# Patient Record
Sex: Female | Born: 1945 | Race: Black or African American | Hispanic: No | Marital: Married | Smoking: Never smoker
Health system: Southern US, Community
[De-identification: ages and names within clinical notes are randomized; demographics above are authoritative.]

## PROBLEM LIST (undated history)

## (undated) DIAGNOSIS — E1169 Type 2 diabetes mellitus with other specified complication: Secondary | ICD-10-CM

## (undated) DIAGNOSIS — I10 Essential (primary) hypertension: Secondary | ICD-10-CM

## (undated) DIAGNOSIS — C50919 Malignant neoplasm of unspecified site of unspecified female breast: Secondary | ICD-10-CM

## (undated) DIAGNOSIS — E785 Hyperlipidemia, unspecified: Secondary | ICD-10-CM

## (undated) DIAGNOSIS — E119 Type 2 diabetes mellitus without complications: Secondary | ICD-10-CM

## (undated) DIAGNOSIS — I2510 Atherosclerotic heart disease of native coronary artery without angina pectoris: Secondary | ICD-10-CM

## (undated) DIAGNOSIS — I509 Heart failure, unspecified: Secondary | ICD-10-CM

## (undated) DIAGNOSIS — E669 Obesity, unspecified: Secondary | ICD-10-CM

---

## 2000-06-18 HISTORY — PX: BREAST LUMPECTOMY: SHX2

## 2017-08-18 ENCOUNTER — Encounter (HOSPITAL_COMMUNITY): Payer: Self-pay

## 2017-08-18 ENCOUNTER — Emergency Department (HOSPITAL_COMMUNITY): Payer: Medicare (Managed Care)

## 2017-08-18 ENCOUNTER — Other Ambulatory Visit: Payer: Self-pay

## 2017-08-18 ENCOUNTER — Inpatient Hospital Stay (HOSPITAL_COMMUNITY)
Admission: EM | Admit: 2017-08-18 | Discharge: 2017-08-22 | DRG: 291 | Disposition: A | Discharge status: Home / Self Care | Payer: Medicare (Managed Care) | Attending: Internal Medicine | Admitting: Internal Medicine

## 2017-08-18 DIAGNOSIS — E785 Hyperlipidemia, unspecified: Secondary | ICD-10-CM | POA: Diagnosis present

## 2017-08-18 DIAGNOSIS — E1169 Type 2 diabetes mellitus with other specified complication: Secondary | ICD-10-CM | POA: Diagnosis present

## 2017-08-18 DIAGNOSIS — E669 Obesity, unspecified: Secondary | ICD-10-CM | POA: Diagnosis present

## 2017-08-18 DIAGNOSIS — J810 Acute pulmonary edema: Secondary | ICD-10-CM

## 2017-08-18 DIAGNOSIS — E872 Acidosis: Secondary | ICD-10-CM | POA: Diagnosis present

## 2017-08-18 DIAGNOSIS — D649 Anemia, unspecified: Secondary | ICD-10-CM | POA: Diagnosis present

## 2017-08-18 DIAGNOSIS — N189 Chronic kidney disease, unspecified: Secondary | ICD-10-CM | POA: Diagnosis present

## 2017-08-18 DIAGNOSIS — Z794 Long term (current) use of insulin: Secondary | ICD-10-CM | POA: Diagnosis not present

## 2017-08-18 DIAGNOSIS — I2510 Atherosclerotic heart disease of native coronary artery without angina pectoris: Secondary | ICD-10-CM | POA: Diagnosis present

## 2017-08-18 DIAGNOSIS — I5031 Acute diastolic (congestive) heart failure: Secondary | ICD-10-CM | POA: Diagnosis not present

## 2017-08-18 DIAGNOSIS — N179 Acute kidney failure, unspecified: Secondary | ICD-10-CM | POA: Diagnosis present

## 2017-08-18 DIAGNOSIS — Z955 Presence of coronary angioplasty implant and graft: Secondary | ICD-10-CM

## 2017-08-18 DIAGNOSIS — I509 Heart failure, unspecified: Secondary | ICD-10-CM | POA: Diagnosis present

## 2017-08-18 DIAGNOSIS — I10 Essential (primary) hypertension: Secondary | ICD-10-CM | POA: Diagnosis not present

## 2017-08-18 DIAGNOSIS — I255 Ischemic cardiomyopathy: Secondary | ICD-10-CM | POA: Diagnosis present

## 2017-08-18 DIAGNOSIS — I130 Hypertensive heart and chronic kidney disease with heart failure and stage 1 through stage 4 chronic kidney disease, or unspecified chronic kidney disease: Principal | ICD-10-CM | POA: Diagnosis present

## 2017-08-18 DIAGNOSIS — C50919 Malignant neoplasm of unspecified site of unspecified female breast: Secondary | ICD-10-CM | POA: Diagnosis not present

## 2017-08-18 DIAGNOSIS — E1122 Type 2 diabetes mellitus with diabetic chronic kidney disease: Secondary | ICD-10-CM | POA: Diagnosis present

## 2017-08-18 DIAGNOSIS — J9602 Acute respiratory failure with hypercapnia: Secondary | ICD-10-CM

## 2017-08-18 DIAGNOSIS — Z853 Personal history of malignant neoplasm of breast: Secondary | ICD-10-CM | POA: Diagnosis not present

## 2017-08-18 DIAGNOSIS — R0902 Hypoxemia: Secondary | ICD-10-CM

## 2017-08-18 DIAGNOSIS — J9601 Acute respiratory failure with hypoxia: Secondary | ICD-10-CM | POA: Diagnosis present

## 2017-08-18 DIAGNOSIS — I5041 Acute combined systolic (congestive) and diastolic (congestive) heart failure: Secondary | ICD-10-CM | POA: Diagnosis not present

## 2017-08-18 HISTORY — DX: Malignant neoplasm of unspecified site of unspecified female breast: C50.919

## 2017-08-18 HISTORY — DX: Essential (primary) hypertension: I10

## 2017-08-18 HISTORY — DX: Atherosclerotic heart disease of native coronary artery without angina pectoris: I25.10

## 2017-08-18 HISTORY — DX: Type 2 diabetes mellitus with other specified complication: E66.9

## 2017-08-18 HISTORY — DX: Hyperlipidemia, unspecified: E78.5

## 2017-08-18 HISTORY — DX: Type 2 diabetes mellitus without complications: E11.9

## 2017-08-18 HISTORY — DX: Heart failure, unspecified: I50.9

## 2017-08-18 HISTORY — DX: Obesity, unspecified: E11.69

## 2017-08-18 LAB — I-STAT ARTERIAL BLOOD GAS, ED
Acid-base deficit: 3 mmol/L — ABNORMAL HIGH (ref 0.0–2.0)
Bicarbonate: 23.8 mmol/L (ref 20.0–28.0)
O2 Saturation: 100 %
PCO2 ART: 49.8 mmHg — AB (ref 32.0–48.0)
TCO2: 25 mmol/L (ref 22–32)
pH, Arterial: 7.287 — ABNORMAL LOW (ref 7.350–7.450)
pO2, Arterial: 191 mmHg — ABNORMAL HIGH (ref 83.0–108.0)

## 2017-08-18 LAB — RESPIRATORY PANEL BY PCR
Adenovirus: NOT DETECTED
BORDETELLA PERTUSSIS-RVPCR: NOT DETECTED
CORONAVIRUS OC43-RVPPCR: NOT DETECTED
Chlamydophila pneumoniae: NOT DETECTED
Coronavirus 229E: NOT DETECTED
Coronavirus HKU1: NOT DETECTED
Coronavirus NL63: NOT DETECTED
INFLUENZA A-RVPPCR: NOT DETECTED
Influenza B: NOT DETECTED
METAPNEUMOVIRUS-RVPPCR: NOT DETECTED
Mycoplasma pneumoniae: NOT DETECTED
PARAINFLUENZA VIRUS 1-RVPPCR: NOT DETECTED
PARAINFLUENZA VIRUS 2-RVPPCR: NOT DETECTED
PARAINFLUENZA VIRUS 4-RVPPCR: NOT DETECTED
Parainfluenza Virus 3: NOT DETECTED
RESPIRATORY SYNCYTIAL VIRUS-RVPPCR: NOT DETECTED
RHINOVIRUS / ENTEROVIRUS - RVPPCR: NOT DETECTED

## 2017-08-18 LAB — CBC WITH DIFFERENTIAL/PLATELET
Basophils Absolute: 0 10*3/uL (ref 0.0–0.1)
Basophils Relative: 0 %
Eosinophils Absolute: 0.3 10*3/uL (ref 0.0–0.7)
Eosinophils Relative: 4 %
HEMATOCRIT: 33.8 % — AB (ref 36.0–46.0)
Hemoglobin: 10.8 g/dL — ABNORMAL LOW (ref 12.0–15.0)
LYMPHS PCT: 29 %
Lymphs Abs: 2.8 10*3/uL (ref 0.7–4.0)
MCH: 28.3 pg (ref 26.0–34.0)
MCHC: 32 g/dL (ref 30.0–36.0)
MCV: 88.7 fL (ref 78.0–100.0)
MONO ABS: 0.9 10*3/uL (ref 0.1–1.0)
MONOS PCT: 9 %
NEUTROS ABS: 5.8 10*3/uL (ref 1.7–7.7)
Neutrophils Relative %: 58 %
Platelets: 392 10*3/uL (ref 150–400)
RBC: 3.81 MIL/uL — ABNORMAL LOW (ref 3.87–5.11)
RDW: 14.6 % (ref 11.5–15.5)
WBC: 9.7 10*3/uL (ref 4.0–10.5)

## 2017-08-18 LAB — COMPREHENSIVE METABOLIC PANEL
ALT: 19 U/L (ref 14–54)
AST: 21 U/L (ref 15–41)
Albumin: 3.5 g/dL (ref 3.5–5.0)
Alkaline Phosphatase: 102 U/L (ref 38–126)
Anion gap: 13 (ref 5–15)
BUN: 29 mg/dL — AB (ref 6–20)
CHLORIDE: 105 mmol/L (ref 101–111)
CO2: 20 mmol/L — AB (ref 22–32)
CREATININE: 1.72 mg/dL — AB (ref 0.44–1.00)
Calcium: 9.1 mg/dL (ref 8.9–10.3)
GFR calc Af Amer: 34 mL/min — ABNORMAL LOW (ref >60)
GFR, EST NON AFRICAN AMERICAN: 29 mL/min — AB (ref >60)
Glucose, Bld: 316 mg/dL — ABNORMAL HIGH (ref 65–99)
Potassium: 4.4 mmol/L (ref 3.5–5.1)
SODIUM: 138 mmol/L (ref 135–145)
Total Bilirubin: 0.7 mg/dL (ref 0.3–1.2)
Total Protein: 8.3 g/dL — ABNORMAL HIGH (ref 6.5–8.1)

## 2017-08-18 LAB — GLUCOSE, CAPILLARY
Glucose-Capillary: 180 mg/dL — ABNORMAL HIGH (ref 65–99)
Glucose-Capillary: 228 mg/dL — ABNORMAL HIGH (ref 65–99)

## 2017-08-18 LAB — HEMOGLOBIN A1C
Hgb A1c MFr Bld: 8.5 % — ABNORMAL HIGH (ref 4.8–5.6)
Mean Plasma Glucose: 197.25 mg/dL

## 2017-08-18 LAB — BRAIN NATRIURETIC PEPTIDE: B Natriuretic Peptide: 870.3 pg/mL — ABNORMAL HIGH (ref 0.0–100.0)

## 2017-08-18 LAB — TROPONIN I: Troponin I: <0.03 ng/mL (ref <0.03)

## 2017-08-18 MED ORDER — LABETALOL HCL 200 MG PO TABS
300.0000 mg | ORAL_TABLET | Freq: Two times a day (BID) | ORAL | Status: DC
  Administered 2017-08-18 – 2017-08-22 (×8): 300 mg via ORAL
  Filled 2017-08-18 (×8): qty 1

## 2017-08-18 MED ORDER — ASPIRIN 81 MG PO CHEW
81.0000 mg | CHEWABLE_TABLET | Freq: Every day | ORAL | Status: DC
  Administered 2017-08-19 – 2017-08-22 (×4): 81 mg via ORAL
  Filled 2017-08-18 (×4): qty 1

## 2017-08-18 MED ORDER — FUROSEMIDE 10 MG/ML IJ SOLN
80.0000 mg | Freq: Once | INTRAMUSCULAR | Status: AC
  Administered 2017-08-18: 80 mg via INTRAVENOUS
  Filled 2017-08-18: qty 8

## 2017-08-18 MED ORDER — INSULIN ASPART 100 UNIT/ML ~~LOC~~ SOLN
0.0000 [IU] | Freq: Every day | SUBCUTANEOUS | Status: DC
  Administered 2017-08-18: 2 [IU] via SUBCUTANEOUS
  Administered 2017-08-19: 4 [IU] via SUBCUTANEOUS
  Administered 2017-08-21: 3 [IU] via SUBCUTANEOUS

## 2017-08-18 MED ORDER — SODIUM CHLORIDE 0.9% FLUSH: 3.0000 mL | INTRAVENOUS | Status: DC | PRN

## 2017-08-18 MED ORDER — SODIUM CHLORIDE 0.9 % IV SOLN: 250.0000 mL | INTRAVENOUS | Status: DC | PRN

## 2017-08-18 MED ORDER — AMLODIPINE 1 MG/ML ORAL SUSPENSION: 10.0000 mg | Freq: Every day | ORAL | Status: DC

## 2017-08-18 MED ORDER — ENOXAPARIN SODIUM 40 MG/0.4ML ~~LOC~~ SOLN
40.0000 mg | SUBCUTANEOUS | Status: DC
  Administered 2017-08-20 – 2017-08-22 (×3): 40 mg via SUBCUTANEOUS
  Filled 2017-08-18 (×3): qty 0.4

## 2017-08-18 MED ORDER — AMLODIPINE BESYLATE 10 MG PO TABS
10.0000 mg | ORAL_TABLET | Freq: Every day | ORAL | Status: DC
  Administered 2017-08-18 – 2017-08-22 (×5): 10 mg via ORAL
  Filled 2017-08-18 (×2): qty 1
  Filled 2017-08-18: qty 2
  Filled 2017-08-18 (×2): qty 1

## 2017-08-18 MED ORDER — INSULIN ASPART 100 UNIT/ML ~~LOC~~ SOLN
0.0000 [IU] | Freq: Three times a day (TID) | SUBCUTANEOUS | Status: DC
  Administered 2017-08-18: 2 [IU] via SUBCUTANEOUS
  Administered 2017-08-19: 7 [IU] via SUBCUTANEOUS
  Administered 2017-08-19: 3 [IU] via SUBCUTANEOUS
  Administered 2017-08-19: 5 [IU] via SUBCUTANEOUS
  Administered 2017-08-20: 1 [IU] via SUBCUTANEOUS
  Administered 2017-08-20 – 2017-08-21 (×3): 3 [IU] via SUBCUTANEOUS
  Administered 2017-08-21: 1 [IU] via SUBCUTANEOUS
  Administered 2017-08-21: 3 [IU] via SUBCUTANEOUS
  Administered 2017-08-22: 2 [IU] via SUBCUTANEOUS
  Administered 2017-08-22: 5 [IU] via SUBCUTANEOUS
  Administered 2017-08-22: 3 [IU] via SUBCUTANEOUS

## 2017-08-18 MED ORDER — NITROGLYCERIN IN D5W 200-5 MCG/ML-% IV SOLN
INTRAVENOUS | Status: AC
  Filled 2017-08-18: qty 250

## 2017-08-18 MED ORDER — ASPIRIN 81 MG PO CHEW
324.0000 mg | CHEWABLE_TABLET | Freq: Once | ORAL | Status: AC
  Administered 2017-08-18: 324 mg via ORAL
  Filled 2017-08-18: qty 4

## 2017-08-18 MED ORDER — SODIUM CHLORIDE 0.9% FLUSH
3.0000 mL | Freq: Two times a day (BID) | INTRAVENOUS | Status: DC
  Administered 2017-08-18 – 2017-08-22 (×8): 3 mL via INTRAVENOUS

## 2017-08-18 MED ORDER — ACETAMINOPHEN 325 MG PO TABS
650.0000 mg | ORAL_TABLET | ORAL | Status: DC | PRN
  Administered 2017-08-20: 650 mg via ORAL
  Filled 2017-08-18: qty 2

## 2017-08-18 MED ORDER — NITROGLYCERIN IN D5W 200-5 MCG/ML-% IV SOLN
0.0000 ug/min | Freq: Once | INTRAVENOUS | Status: AC
  Administered 2017-08-18: 100 ug/min via INTRAVENOUS

## 2017-08-18 MED ORDER — ONDANSETRON HCL 4 MG/2ML IJ SOLN: 4.0000 mg | Freq: Four times a day (QID) | INTRAMUSCULAR | Status: DC | PRN

## 2017-08-18 NOTE — H&P (Addendum)
History and Physical    Charlotte Phelps ZOX:[id number] DOB: 1946-04-02 DOA: 08/18/2017  **Will admit patient based on the expectation that the patient will need hospitalization/ hospital care that crosses at least 2 midnights  PCP: Patient, No Pcp Per Marlis Edelson, MD  Attending physician: Evangeline Gula  Patient coming from/Resides with: Private residence/husband  Chief Complaint: Acute shortness of breath  HPI: Charlotte Phelps is a 72 y.o. female with medical history significant for CAD with reported prior stent, CHF (?  Ischemic cardiomyopathy), hypertension, diabetes, obesity, dyslipidemia, history of breast cancer in 2002 is post lumpectomy with x-ray therapy and Adriamycin and Cytoxan in 2002 followed by tamoxifen x 2.5 years and Aromasin x 5 years.  Patient currently resides in [place] with her husband and has traveled to this area to visit their daughter.  Patient was last hospitalized for heart failure symptomatology on February 7 and discharged on February 10.  During that hospitalization she underwent echocardiogram.  Patient denies recent changes in medications.  She reports her dry weight is 230 pounds.  She typically takes an extra 40 mg of Lasix twice weekly for weight gain to prevent exacerbation of heart failure.  Patient arrived to this area about 1 week ago and this past Friday developed nonspecific, nonproductive cough without fever although in the past several days her husband reported she was having chills.  She had taken over-the-counter cold preparation appropriate for a person with hypertension.  She has not had any dietary indiscretion and did not miss any medications or indulged in dietary indiscretion since arriving to [place].  She awakened this morning at her usual time and after she began moving around noticed significant worsening shortness of breath.  She was brought to the ER by family; on upon arrival to triage patient was in extremis with labored respirations  at 40 breaths/min with bilateral crackles and she was unable to talk in complete sentences.  She was immediately taken to a treatment room.  Due to severity of symptoms room air O2 saturations were not obtained and patient was immediately placed on BiPAP with 100% FiO2 and after 10 minutes was noted to be breathing more with O2 sats in the upper 90s.  ABG was obtained that revealed respiratory acidosis.  X-ray revealed findings consistent with heart failure.  Since blood pressure was significantly elevated at presentation at 189/102.  She was given Lasix 80 mg IV x1 and was briefly initiated on a nitroglycerin infusion..  Subsequent BNP was elevated at 870.  Her PO2 on 100% FiO2 was 191.  After about 45 minutes her symptoms had improved remarkably and she had voided a large amount of urine.  She was able to be transitioned to nasal cannula oxygen and the nitroglycerin infusion was discontinued.  She denied any chest pain or dyspnea on exertion and reports that her blood pressure has been well controlled on her current at-home regimen.  ED Course:  Vital Signs: BP (!) 165/70   Pulse 81   Resp 17   SpO2 98%  CXR: Congestive heart failure with interstitial and airspace pulmonary edema Lab data: Sodium 138, potassium 4.4, chloride 105, CO2 20, glucose 316, BUN 29, creatinine 1.72, LFTs not elevated, BNP 870, troponin <0.03, count 9700 normal differential, hemoglobin 10.8, platelets 392,000 Medications and treatmetns:Nitroglycerin infusion subsequently discontinued, Lasix 80 mg IV x1, aspirin 324 mg x1  Review of Systems:  In addition to the HPI above,  No Fever, lmyalgias or other constitutional symptoms No Headache, changes with Vision or hearing,  new weakness, tingling, numbness in any extremity, dizziness, dysarthria or word finding difficulty, gait disturbance or imbalance, tremors or seizure activity No problems swallowing food or Liquids, indigestion/reflux, choking or coughing while eating,  abdominal pain with or after eating No Chest pain, palpitations, orthopnea No Abdominal pain, N/V, melena,hematochezia, dark tarry stools, constipation No dysuria, malodorous urine, hematuria or flank pain No new skin rashes, lesions, masses or bruises, No new joint pains, aches, swelling or redness No recent unintentional weight gain or loss No polyuria, polydypsia or polyphagia   Past Medical History:  Diagnosis Date  . Breast cancer ([place])    Breast Cancer - T1cN1 ER pos right lumpectomy/XRT, Adria/Cytoxan 2002, tamoxifen x 2.5 years, Aromasin x years  . CAD (coronary artery disease)    History of prior stent  . CHF (congestive heart failure) ([place])   . Diabetes mellitus type 2 in obese ([place])   . HLD (hyperlipidemia)   . HTN (hypertension)     History reviewed. No pertinent surgical history.  Social History   Socioeconomic History  . Marital status: Married    Spouse name: Not on file  . Number of children: Not on file  . Years of education: Not on file  . Highest education level: Not on file  Social Needs  . Financial resource strain: Not on file  . Food insecurity - worry: Not on file  . Food insecurity - inability: Not on file  . Transportation needs - medical: Not on file  . Transportation needs - non-medical: Not on file  Occupational History  . Not on file  Tobacco Use  . Smoking status: Never Smoker  . Smokeless tobacco: Never Used  Substance and Sexual Activity  . Alcohol use: No    Frequency: Never  . Drug use: No  . Sexual activity: Not on file  Other Topics Concern  . Not on file  Social History Narrative  . Not on file    Mobility: Independent Work history: Not Obtained   Allergies  Allergen Reactions  . Lisinopril Other (See Comments)    Watery Eyes  . Sulfa Antibiotics      Family history reviewed and not pertinent to current admission findings and diagnosis  Prior to Admission medications   **Based on patient recollection Aspirin 81  mg daily Amlodipine 10 mg daily Labetalol 300 mg twice daily Lasix 40 mg daily     Physical Exam: Vitals:   08/18/17 1130 08/18/17 1135 08/18/17 1145 08/18/17 1155  BP: (!) 164/65 (!) 157/70 (!) 162/79 (!) 165/70  Pulse: 78 72 84 81  Resp: 17 17 14 17   SpO2: 97% 97% 97% 98%      Constitutional: NAD, calm, comfortable Eyes: PERRL, lids and conjunctivae normal ENMT: Mucous membranes are moist. Posterior pharynx clear of any exudate or lesions.Normal dentition.  Neck: normal, supple, no masses, no thyromegaly Respiratory: Bilateral crackles on posterior exam.  Normal respiratory effort without accessory muscle use at rest. 2L Cardiovascular: Regular rate and rhythm, no murmurs / rubs / gallops.  Trace to 1+ bilateral lower extremity edema. 2+ pedal pulses. No carotid bruits.  Abdomen: no tenderness, no masses palpated. No hepatosplenomegaly. Bowel sounds positive.  Musculoskeletal: no clubbing / cyanosis. No joint deformity upper and lower extremities. Good ROM, no contractures. Normal muscle tone.  Skin: no rashes, lesions, ulcers. No induration Neurologic: CN 2-12 grossly intact. Sensation intact, DTR normal. Strength 5/5 x all 4 extremities.  Psychiatric: Normal judgment and insight. Alert and oriented x 3. Normal mood.  Labs on Admission: I have personally reviewed following labs and imaging studies  CBC: Recent Labs  Lab 08/18/17 0905  WBC 9.7  NEUTROABS 5.8  HGB 10.8*  HCT 33.8*  MCV 88.7  PLT 532   Basic Metabolic Panel: Recent Labs  Lab 08/18/17 0905  NA 138  K 4.4  CL 105  CO2 20*  GLUCOSE 316*  BUN 29*  CREATININE 1.72*  CALCIUM 9.1   GFR: CrCl cannot be calculated (Unknown ideal weight.). Liver Function Tests: Recent Labs  Lab 08/18/17 0905  AST 21  ALT 19  ALKPHOS 102  BILITOT 0.7  PROT 8.3*  ALBUMIN 3.5   No results for input(s): LIPASE, AMYLASE in the last 168 hours. No results for input(s): AMMONIA in the last 168  hours. Coagulation Profile: No results for input(s): INR, PROTIME in the last 168 hours. Cardiac Enzymes: Recent Labs  Lab 08/18/17 0905  TROPONINI <0.03   BNP (last 3 results) No results for input(s): PROBNP in the last 8760 hours. HbA1C: No results for input(s): HGBA1C in the last 72 hours. CBG: No results for input(s): GLUCAP in the last 168 hours. Lipid Profile: No results for input(s): CHOL, HDL, LDLCALC, TRIG, CHOLHDL, LDLDIRECT in the last 72 hours. Thyroid Function Tests: No results for input(s): TSH, T4TOTAL, FREET4, T3FREE, THYROIDAB in the last 72 hours. Anemia Panel: No results for input(s): VITAMINB12, FOLATE, FERRITIN, TIBC, IRON, RETICCTPCT in the last 72 hours. Urine analysis: No results found for: COLORURINE, APPEARANCEUR, LABSPEC, PHURINE, GLUCOSEU, HGBUR, BILIRUBINUR, KETONESUR, PROTEINUR, UROBILINOGEN, NITRITE, LEUKOCYTESUR Sepsis Labs: @LABRCNTIP (procalcitonin:4,lacticidven:4) )No results found for this or any previous visit (from the past 240 hour(s)).   Radiological Exams on Admission: Dg Chest Portable 1 View  Result Date: 08/18/2017 CLINICAL DATA:  Pt to ER accompanied by son, reports acute onset shortness of breath this morning, at this time patient tripoding in triage, labored respirations 40+, EXAM: PORTABLE CHEST 1 VIEW COMPARISON:  None. FINDINGS: Cardiac silhouette is borderline enlarged. No mediastinal or hilar masses. Lungs show prominent bronchovascular markings, mild interstitial thickening and hazy airspace opacity, the latter in the lower lungs. Probable pleural effusions. No pneumothorax. Skeletal structures are demineralized but grossly intact. IMPRESSION: 1. Findings consistent with congestive heart failure with interstitial and airspace pulmonary edema. Electronically Signed   By: Lajean Manes M.D.   On: 08/18/2017 09:34    EKG: (Independently reviewed) sinus rhythm with ventricular rate 90 bpm, QTC 459 ms, slightly delayed R wave rotation  with possible right axis deviation, no acute ischemic changes  Assessment/Plan Principal Problem:   Acute respiratory failure with hypoxia and hypercarbia  -Patient presents with several days of productive cough and chills followed by acute respiratory failure requiring transient BiPAP support-now stable on 2 L oxygen -Suspect patient has primary CHF exacerbation potentially exacerbated by viral syndrome -Check respiratory viral panel -Treat underlying heart failure and hypertension -Continue supportive care with oxygen-wean as tolerated -Follow pulse oximetry  Active Problems:   Acute congestive heart failure  -Presents with acute respiratory symptoms as above, elevated BNP and chest x-ray consistent with heart failure exacerbation -Subtype unknown with husband reporting that patient's heart failure was "treated" after stent was placed leading one to surmise this is ischemic cardiomyopathy -Patient hospitalized early February for heart failure exacerbation and underwent echo in [place]-we will attempt to obtain records from [facility] facility is not on epic -Symptoms improved with administration of Lasix 80 mg IV in ER-repeat x1 dose and will allow for rounding physician in a.m. to  determine if appropriate to transition to home dose Lasix 40 mg daily -Does not appear to have been on ARB prior to admission-patient reportedly has watery eyes with ACE I -Continue labetalol -Daily weights and strict I's/O -Patient reports dry weight at 230 pounds -According to patient and family typically has to take 2 extra 40 mg doses of Lasix for weight gain weekly    ? Acute kidney injury  -Baseline renal function unknown -Current renal function BUN 29 and creatinine 1.72 -Follow labs with diuresis-if improved would confirm diagnosis of AKI    HTN (hypertension) -Uncontrolled presentation in context of volume overload and respiratory extremis -Continue preadmission Norvasc and  labetalol    Diabetes mellitus type 2 in obese  -Patient reports no longer on metformin and instead utilizes regular insulin meal coverage -SSI -HgbA1c    CAD -Reportedly has prior PCI/stent -Denies issues with chest pain or dyspnea on exertion -Continue beta-blocker and aspirin -Does not appear to be on statin prior to admission    HLD (hyperlipidemia) -Based on patient recollection does not take statin drug    Anemia -Not listed as chronic medical problem -Hemoglobin 10.8 with unknown baseline    History of Breast cancer (2003)    **Additional lab, imaging and/or diagnostic evaluation at discretion of supervising physician  DVT prophylaxis: Lovenox Code Status: Full Family Communication: Husband and daughter Disposition Plan: Home Consults called: None    Charlotte Phelps L. ANP-BC [facility] Pager [phone]   If 7PM-7AM, please contact night-coverage [email or web address] Password [facility]  08/18/2017, 12:59 PM

## 2017-08-18 NOTE — ED Provider Notes (Signed)
[facility] DEPARTMENT Provider Note   CSN: [id number] Arrival date & time: 08/18/17  0845     History   Chief Complaint Chief Complaint  Patient presents with  . Shortness of Breath    HPI Charlotte Phelps is a 72 y.o. female.  HPI  72 year old female with a history of CHF presents with acute dyspnea.  History is taken from patient although this is limited due to her extreme dyspnea.  Husband is also at the bedside to provide history.  Has had a nonproductive cough for couple days and thinks she caught a cold.  However this morning after getting up to go the bathroom she has developed severe dyspnea.  She denies any chest pain.  Family feels like her feet and ankles are swollen.  This feels like prior CHF to her. No fevers. She does not wear oxygen at home. She is traveling from [place] visiting daughter. Denies any change or missed meds, such as BP meds or lasix.  When I am initially evaluating her her O2 sats are in the low 70s on nasal cannula.  Past Medical History:  Diagnosis Date  . Breast cancer ([place])    Breast Cancer - T1cN1 ER pos right lumpectomy/XRT, Adria/Cytoxan 2002, tamoxifen x 2.5 years, Aromasin x years  . CAD (coronary artery disease)    History of prior stent  . CHF (congestive heart failure) ([place])   . Diabetes mellitus type 2 in obese ([place])   . HLD (hyperlipidemia)   . HTN (hypertension)     Patient Active Problem List   Diagnosis Date Noted  . Acute respiratory failure with hypoxia and hypercarbia ([place]) 08/18/2017  . Acute congestive heart failure ([place]) 08/18/2017  . HTN (hypertension) 08/18/2017  . HLD (hyperlipidemia) 08/18/2017  . Diabetes mellitus type 2 in obese ([place]) 08/18/2017  . History of Breast cancer (2003) 08/18/2017  . Acute kidney injury ([place]) 08/18/2017  . Anemia 08/18/2017    Past Surgical History:  Procedure Laterality Date  . BREAST LUMPECTOMY Right 2002    OB History    No data available        Home Medications    Prior to Admission medications   Medication Sig Start Date End Date Taking? Authorizing Provider  acetaminophen (TYLENOL) 500 MG tablet Take 1,000 mg by mouth every 6 (six) hours as needed for mild pain.   Yes [provider]  amLODipine (NORVASC) 10 MG tablet Take 10 mg by mouth daily. 07/24/17  Yes [provider]  aspirin EC 81 MG tablet Take 81 mg by mouth daily. 01/15/12  Yes [provider]  atorvastatin (LIPITOR) 80 MG tablet Take 80 mg by mouth daily.   Yes [provider]  Cholecalciferol (VITAMIN D3) 5000 units CAPS Take 10,000 Units by mouth daily.   Yes [provider]  Dextromethorphan-guaiFENesin (CORICIDIN HBP CONGESTION/COUGH) 10-200 MG CAPS Take 2 tablets by mouth as needed (Congestion and Cough).   Yes [provider]  furosemide (LASIX) 20 MG tablet Take 40 mg by mouth daily. 07/15/17  Yes [provider]  GuaiFENesin (MUCUS RELIEF ADULT PO) Take 1 tablet by mouth 2 (two) times daily.   Yes [provider]  losartan (COZAAR) 100 MG tablet Take 100 mg by mouth daily. 08/15/17  Yes [provider]  Multiple Vitamins-Minerals (WOMENS 50+ ADVANCED PO) Take 1 tablet by mouth daily.   Yes [provider]  NOVOLOG FLEXPEN 100 UNIT/ML FlexPen Inject 35 Units into the skin 3 (three)  times daily with meals. 08/07/17  Yes [provider]  TRESIBA FLEXTOUCH 100 UNIT/ML SOPN FlexTouch Pen Inject 35-60 Units into the skin daily. Sliding Scale 08/08/17  Yes [provider]  VENTOLIN HFA 108 (90 Base) MCG/ACT inhaler Inhale 2 puffs into the lungs every 6 (six) hours as needed for shortness of breath or wheezing. 08/15/17  Yes [provider]    Family History History reviewed. No pertinent family history.  Social History Social History   Tobacco Use  . Smoking status: Never Smoker  . Smokeless tobacco: Never Used  Substance Use Topics  . Alcohol use:  No    Frequency: Never  . Drug use: No     Allergies   Lisinopril and Sulfa antibiotics   Review of Systems Review of Systems  Unable to perform ROS: Severe respiratory distress     Physical Exam Updated Vital Signs BP (!) 173/74   Pulse 87   Resp (!) 28   SpO2 100%   Physical Exam  Constitutional: She is oriented to person, place, and time. She appears well-developed and well-nourished. She appears distressed.  obese  HENT:  Head: Normocephalic and atraumatic.  Right Ear: External ear normal.  Left Ear: External ear normal.  Nose: Nose normal.  Eyes: Right eye exhibits no discharge. Left eye exhibits no discharge.  Cardiovascular: Normal rate, regular rhythm and normal heart sounds.  Pulmonary/Chest: Accessory muscle usage present. Tachypnea noted. She is in respiratory distress. She has rales in the right lower field and the left lower field.  Abdominal: Soft. There is no tenderness.  Musculoskeletal: She exhibits edema (trace nonpitting edema to bilateral feet).  Neurological: She is alert and oriented to person, place, and time.  Skin: Skin is warm and dry. She is not diaphoretic.  Nursing note and vitals reviewed.    ED Treatments / Results  Labs (all labs ordered are listed, but only abnormal results are displayed) Labs Reviewed  COMPREHENSIVE METABOLIC PANEL - Abnormal; Notable for the following components:      Result Value   CO2 20 (*)    Glucose, Bld 316 (*)    BUN 29 (*)    Creatinine, Ser 1.72 (*)    Total Protein 8.3 (*)    GFR calc non Af Amer 29 (*)    GFR calc Af Amer 34 (*)    All other components within normal limits  BRAIN NATRIURETIC PEPTIDE - Abnormal; Notable for the following components:   B Natriuretic Peptide 870.3 (*)    All other components within normal limits  CBC WITH DIFFERENTIAL/PLATELET - Abnormal; Notable for the following components:   RBC 3.81 (*)    Hemoglobin 10.8 (*)    HCT 33.8 (*)    All other components within  normal limits  I-STAT ARTERIAL BLOOD GAS, ED - Abnormal; Notable for the following components:   pH, Arterial 7.287 (*)    pCO2 arterial 49.8 (*)    pO2, Arterial 191.0 (*)    Acid-base deficit 3.0 (*)    All other components within normal limits  RESPIRATORY PANEL BY PCR  TROPONIN I  HEMOGLOBIN A1C    EKG  EKG Interpretation  Date/Time:  Sunday August 18 2017 08:50:08 EST Ventricular Rate:  95 PR Interval:  160 QRS Duration: 90 QT Interval:  366 QTC Calculation: 459 R Axis:   85 Text Interpretation:  Normal sinus rhythm Nonspecific ST abnormality Abnormal ECG poor data quality limb leads No old tracing to compare Confirmed by Sherwood Gambler (  [id number]) on 08/18/2017 8:54:45 AM       EKG Interpretation  Date/Time:  Sunday August 18 2017 09:13:03 EST Ventricular Rate:  90 PR Interval:  160 QRS Duration: 96 QT Interval:  375 QTC Calculation: 459 R Axis:   84 Text Interpretation:  Sinus rhythm Borderline right axis deviation mild ST depressions better tracing, otherwise similar to earlier in the day Confirmed by Sherwood Gambler [phone]) on 08/18/2017 9:16:27 AM       Radiology Dg Chest Portable 1 View  Result Date: 08/18/2017 CLINICAL DATA:  Pt to ER accompanied by son, reports acute onset shortness of breath this morning, at this time patient tripoding in triage, labored respirations 40+, EXAM: PORTABLE CHEST 1 VIEW COMPARISON:  None. FINDINGS: Cardiac silhouette is borderline enlarged. No mediastinal or hilar masses. Lungs show prominent bronchovascular markings, mild interstitial thickening and hazy airspace opacity, the latter in the lower lungs. Probable pleural effusions. No pneumothorax. Skeletal structures are demineralized but grossly intact. IMPRESSION: 1. Findings consistent with congestive heart failure with interstitial and airspace pulmonary edema. Electronically Signed   By: Lajean Manes M.D.   On: 08/18/2017 09:34    Procedures .Critical Care Performed by: Sherwood Gambler, MD Authorized by: Sherwood Gambler, MD   Critical care provider statement:    Critical care time (minutes):  45   Critical care time was exclusive of:  Separately billable procedures and treating other patients   Critical care was necessary to treat or prevent imminent or life-threatening deterioration of the following conditions:  Cardiac failure and respiratory failure   Critical care was time spent personally by me on the following activities:  Development of treatment plan with patient or surrogate, discussions with consultants, evaluation of patient's response to treatment, examination of patient, obtaining history from patient or surrogate, ordering and performing treatments and interventions, ordering and review of laboratory studies, ordering and review of radiographic studies, pulse oximetry, re-evaluation of patient's condition and ventilator management   (including critical care time)  Medications Ordered in ED Medications  insulin aspart (novoLOG) injection 0-5 Units (not administered)  insulin aspart (novoLOG) injection 0-9 Units (not administered)  labetalol (NORMODYNE) tablet 300 mg (0 mg Oral Hold 08/18/17 1507)  furosemide (LASIX) injection 80 mg (not administered)  aspirin chewable tablet 81 mg (81 mg Oral Not Given 08/18/17 1430)  sodium chloride flush (NS) 0.9 % injection 3 mL (3 mLs Intravenous Given 08/18/17 1511)  sodium chloride flush (NS) 0.9 % injection 3 mL (not administered)  0.9 %  sodium chloride infusion (not administered)  acetaminophen (TYLENOL) tablet 650 mg (not administered)  ondansetron (ZOFRAN) injection 4 mg (not administered)  enoxaparin (LOVENOX) injection 40 mg (40 mg Subcutaneous Not Given 08/18/17 1507)  amLODipine (NORVASC) tablet 10 mg (10 mg Oral Given 08/18/17 1510)  nitroGLYCERIN 50 mg in dextrose 5 % 250 mL (0.2 mg/mL) infusion (0 mcg/min Intravenous Stopped 08/18/17 1051)  aspirin chewable tablet 324 mg (324 mg Oral Given 08/18/17 1155)   furosemide (LASIX) injection 80 mg (80 mg Intravenous Given 08/18/17 1157)     Initial Impression / Assessment and Plan / ED Course  I have reviewed the triage vital signs and the nursing notes.  Pertinent labs & imaging results that were available during my care of the patient were reviewed by me and considered in my medical decision making (see chart for details).     Patient presents with acute respiratory failure with significant hypoxia with O2 sats in the 60s and 70s.  She was placed  on BiPAP and experienced immediate improvement.  Given her hypertension with blood pressures in the 782 systolic she was also placed on a nitroglycerin drip for acute hypertensive emergency/pulmonary edema.  She quickly improved.  After being on these treatments she was able to be weaned off of both the BiPAP and the nitroglycerin.  She is now on nasal cannula oxygen.  I think probably the hypertension has resulted in the pulmonary edema.  She does have elevated BNP and will be given Lasix as well.  She does not have any chest pain or suggestion that this was an acute MI causing her pulmonary edema.  She appears stable for hospitalist admit.  Final Clinical Impressions(s) / ED Diagnoses   Final diagnoses:  Acute pulmonary edema ([place])  Acute respiratory failure with hypoxia [facility])    ED Discharge Orders    None       Sherwood Gambler, MD 08/18/17 [phone]

## 2017-08-18 NOTE — Progress Notes (Signed)
Pt started on bipap per MD request due to increased WOB, SOB and low O2 sats.  Pt placed on bipap around 0900.  Settings were 10/5, R 15 and 100%.  Pt tolerated bipap well.  Within 10 min pt was breathing more comfortably and O2 sats were in the upper 90s.   ABG was drawn per order.   MD took pt off bipap around 1045 after pt appeared to be in no distress and vitals signs were normal and pt stated her breathing was better.  Pt was placed on [place].  RT will continue to monitor.

## 2017-08-18 NOTE — ED Notes (Signed)
Pt to ER accompanied by son, reports acute onset shortness of breath this morning, at this time patient tripoding in triage, labored respirations 40+, patient is alert/oriented x4, reports hx of CHF. Rales noted on auscultation. Pt appears distressed.

## 2017-08-18 NOTE — ED Triage Notes (Signed)
Pt noted to be very short of breath on arrival, immediately brought to trauma bay. Tachypnic and unable to speak in full sentences. States sudden SOB onset 4hrs ago. Pt's family reports increased cough since Friday. Hx of CHF.

## 2017-08-19 DIAGNOSIS — N179 Acute kidney failure, unspecified: Secondary | ICD-10-CM

## 2017-08-19 DIAGNOSIS — I5041 Acute combined systolic (congestive) and diastolic (congestive) heart failure: Secondary | ICD-10-CM

## 2017-08-19 DIAGNOSIS — I5031 Acute diastolic (congestive) heart failure: Secondary | ICD-10-CM

## 2017-08-19 LAB — GLUCOSE, CAPILLARY
GLUCOSE-CAPILLARY: 327 mg/dL — AB (ref 65–99)
Glucose-Capillary: 230 mg/dL — ABNORMAL HIGH (ref 65–99)
Glucose-Capillary: 264 mg/dL — ABNORMAL HIGH (ref 65–99)
Glucose-Capillary: 322 mg/dL — ABNORMAL HIGH (ref 65–99)

## 2017-08-19 LAB — BASIC METABOLIC PANEL
Anion gap: 11 (ref 5–15)
BUN: 29 mg/dL — ABNORMAL HIGH (ref 6–20)
CALCIUM: 9.2 mg/dL (ref 8.9–10.3)
CO2: 24 mmol/L (ref 22–32)
Chloride: 104 mmol/L (ref 101–111)
Creatinine, Ser: 1.7 mg/dL — ABNORMAL HIGH (ref 0.44–1.00)
GFR calc Af Amer: 34 mL/min — ABNORMAL LOW (ref >60)
GFR, EST NON AFRICAN AMERICAN: 30 mL/min — AB (ref >60)
GLUCOSE: 200 mg/dL — AB (ref 65–99)
Potassium: 3.9 mmol/L (ref 3.5–5.1)
Sodium: 139 mmol/L (ref 135–145)

## 2017-08-19 MED ORDER — INSULIN GLARGINE 100 UNIT/ML ~~LOC~~ SOLN
12.0000 [IU] | Freq: Every day | SUBCUTANEOUS | Status: DC
  Administered 2017-08-19 – 2017-08-22 (×4): 12 [IU] via SUBCUTANEOUS
  Filled 2017-08-19 (×4): qty 0.12

## 2017-08-19 MED ORDER — FUROSEMIDE 10 MG/ML IJ SOLN
40.0000 mg | Freq: Once | INTRAMUSCULAR | Status: AC
  Administered 2017-08-19: 40 mg via INTRAVENOUS
  Filled 2017-08-19: qty 4

## 2017-08-19 NOTE — Progress Notes (Signed)
Inpatient Diabetes Program Recommendations  AACE/ADA: New Consensus Statement on Inpatient Glycemic Control (2015)  Target Ranges:  Prepandial:   less than 140 mg/dL      Peak postprandial:   less than 180 mg/dL (1-2 hours)      Critically ill patients:  140 - 180 mg/dL   Lab Results  Component Value Date   GLUCAP 264 (H) 08/19/2017   HGBA1C 8.5 (H) 08/18/2017   Review of Glycemic Control  Diabetes history: DM 2 Outpatient Diabetes medications: Tresiba 35-60 units, Novolog 35 units tid Current orders for Inpatient glycemic control: Novolog Sensitive Correction 0-9 units tid + Novolog HS scale 0-5 units  Inpatient Diabetes Program Recommendations:    Glucose in the mid 200 range. Patient takes basal insulin at home. Consider weight based dosing, Lantus 12 units Q24 hours.  Thanks,  Tama Headings RN, MSN, BC-ADM, [facility] Inpatient Diabetes Coordinator Team Pager [phone] (8a-5p)

## 2017-08-19 NOTE — Progress Notes (Signed)
[facility] PROGRESS NOTE  Charlotte Phelps TML:[id number] DOB: 1946/05/25 DOA: 08/18/2017   PCP: Patient, No Pcp Per     Assessment/Plan: Principal Problem:   Acute respiratory failure with hypoxia and hypercarbia (HCC) Active Problems:   Acute congestive heart failure (HCC)   HTN (hypertension)   HLD (hyperlipidemia)   Diabetes mellitus type 2 in obese [facility])   History of Breast cancer (2003)   Acute kidney injury ([place])   Anemia    72 y.o. female with medical history significant for CAD with reported prior stent, CHF (?  Ischemic cardiomyopathy), hypertension, diabetes, obesity, dyslipidemia, history of breast cancer in 2002 is post lumpectomy with x-ray therapy and Adriamycin and Cytoxan in 2002 followed by tamoxifen x 2.5 years and Aromasin x 5 years.   presented 3/3 with worsening shortness of breath, thought to be secondary to CHF exacerbation, initially requiring BiPAP in the setting of hypertensive urgency. Chest x-ray consistent with heart failure Not transition off BiPAP and on nasal cannula. Initial BNP 870.   Assessment and plan Acute respiratory failure with hypoxia and hypercarbia  -Patient presents with several days of productive cough and chills followed by acute respiratory failure requiring transient BiPAP support-now stable on 2 L oxygen -Suspect patient has primary CHF exacerbation potentially exacerbated by viral syndrome  Respiratory panel negative -Treat underlying heart failure and hypertension -Continue supportive care with oxygen-wean as tolerated -Follow pulse oximetry      Acute congestive heart failure  -Presents with acute respiratory symptoms as above, elevated BNP and chest x-ray consistent with heart failure exacerbation -Subtype unknown with husband reporting that patient's heart failure was "treated" after stent was placed leading one to surmise this is ischemic cardiomyopathy -Patient hospitalized early February for heart failure  exacerbation and underwent echo in [place]-we will attempt to obtain records from [facility] facility is not on epic -Symptoms improved with administration of Lasix 80 mg IV in ER-repeat  another 80 mg IV on 3/4 transition to home dose Lasix 40 mg daily in the morning if renal function is stable -Does not appear to have been on ARB prior to admission-patient reportedly has watery eyes with ACE I -Continue labetalol -Daily weights and strict I's/O -Patient reports dry weight at 230 pounds -According to patient and family typically has to take 2 extra 40 mg doses of Lasix for weight gain weekly    ? Acute kidney injury  -Baseline renal function unknown -Current renal function BUN 29 and creatinine 1.72, stable in the setting of diuretics -Follow labs with diuresis-if improved would confirm diagnosis of AKI    HTN (hypertension) -Uncontrolled presentation in context of volume overload and respiratory extremis -Continue preadmission Norvasc and labetalol    Diabetes mellitus type 2 in obese  -Patient reports no longer on metformin and instead utilizes regular insulin meal coverage -SSI, started on Lantus 12 units daily -HgbA1c 8.5    CAD -Reportedly has prior PCI/stent, initial troponin negative -Denies issues with chest pain or dyspnea on exertion -Continue beta-blocker and aspirin -Does not appear to be on statin prior to admission    HLD (hyperlipidemia) -Based on patient recollection does not take statin drug    Anemia -Not listed as chronic medical problem -Hemoglobin 10.8 with unknown baseline      DVT prophylaxsis Lovenox  Code Status:   Full code   Family Communication: Discussed in detail with the patient, all imaging results, lab results explained to the patient   Disposition Plan:  Anticipate discharge tomorrow , if renal function stable      Consultants:   None  Procedures:  None  Antibiotics: Anti-infectives (From admission,  onward)   None         HPI/Subjective: Remained short of breath at rest, does not feel this is her  baseline, does not feel ready for discharge  Objective: Vitals:   08/18/17 1612 08/18/17 2006 08/19/17 0330 08/19/17 1154  BP: (!) 147/51 (!) 136/49 (!) 159/57 (!) 142/57  Pulse:  77 87 72  Resp: 20 16 18 18   Temp: 98.4 F (36.9 C) 98.5 F (36.9 C) 98.6 F (37 C) 98.4 F (36.9 C)  TempSrc: Oral Oral Oral Oral  SpO2:  97% 95% 98%  Weight: 106.1 kg (233 lb 12.8 oz)  104.4 kg (230 lb 1.6 oz)   Height: 5\' 5"  (1.651 m)       Intake/Output Summary (Last 24 hours) at 08/19/2017 1505 Last data filed at 08/19/2017 1154 Gross per 24 hour  Intake 843 ml  Output 2500 ml  Net -1657 ml    Exam:  Examination:  General exam: Appears calm and comfortable  Respiratory system: Mild crackles at the bases to auscultation. Respiratory effort normal. Cardiovascular system: S1 & S2 heard, RRR. No JVD, murmurs, rubs, gallops or clicks. 1+ pedal edema. Gastrointestinal system: Abdomen is nondistended, soft and nontender. No organomegaly or masses felt. Normal bowel sounds heard. Central nervous system: Alert and oriented. No focal neurological deficits. Extremities: Symmetric 5 x 5 power. Skin: No rashes, lesions or ulcers Psychiatry: Judgement and insight appear normal. Mood & affect appropriate.     Data Reviewed: I have personally reviewed following labs and imaging studies  Micro Results Recent Results (from the past 240 hour(s))  Respiratory Panel by PCR     Status: None   Collection Time: 08/18/17  6:08 PM  Result Value Ref Range Status   Adenovirus NOT DETECTED NOT DETECTED Final   Coronavirus 229E NOT DETECTED NOT DETECTED Final   Coronavirus HKU1 NOT DETECTED NOT DETECTED Final   Coronavirus NL63 NOT DETECTED NOT DETECTED Final   Coronavirus OC43 NOT DETECTED NOT DETECTED Final   Metapneumovirus NOT DETECTED NOT DETECTED Final   Rhinovirus / Enterovirus NOT DETECTED NOT  DETECTED Final   Influenza A NOT DETECTED NOT DETECTED Final   Influenza B NOT DETECTED NOT DETECTED Final   Parainfluenza Virus 1 NOT DETECTED NOT DETECTED Final   Parainfluenza Virus 2 NOT DETECTED NOT DETECTED Final   Parainfluenza Virus 3 NOT DETECTED NOT DETECTED Final   Parainfluenza Virus 4 NOT DETECTED NOT DETECTED Final   Respiratory Syncytial Virus NOT DETECTED NOT DETECTED Final   Bordetella pertussis NOT DETECTED NOT DETECTED Final   Chlamydophila pneumoniae NOT DETECTED NOT DETECTED Final   Mycoplasma pneumoniae NOT DETECTED NOT DETECTED Final    Comment: Performed at [facility], [address], [place], [place] [address]    Radiology Reports Dg Chest Portable 1 View  Result Date: 08/18/2017 CLINICAL DATA:  Pt to ER accompanied by son, reports acute onset shortness of breath this morning, at this time patient tripoding in triage, labored respirations 40+, EXAM: PORTABLE CHEST 1 VIEW COMPARISON:  None. FINDINGS: Cardiac silhouette is borderline enlarged. No mediastinal or hilar masses. Lungs show prominent bronchovascular markings, mild interstitial thickening and hazy airspace opacity, the latter in the lower lungs. Probable pleural effusions. No pneumothorax. Skeletal structures are demineralized but grossly intact. IMPRESSION: 1. Findings consistent with congestive heart failure with interstitial  and airspace pulmonary edema. Electronically Signed   By: Lajean Manes M.D.   On: 08/18/2017 09:34     CBC Recent Labs  Lab 08/18/17 0905  WBC 9.7  HGB 10.8*  HCT 33.8*  PLT 392  MCV 88.7  MCH 28.3  MCHC 32.0  RDW 14.6  LYMPHSABS 2.8  MONOABS 0.9  EOSABS 0.3  BASOSABS 0.0    Chemistries  Recent Labs  Lab 08/18/17 0905 08/19/17 0320  NA 138 139  K 4.4 3.9  CL 105 104  CO2 20* 24  GLUCOSE 316* 200*  BUN 29* 29*  CREATININE 1.72* 1.70*  CALCIUM 9.1 9.2  AST 21  --   ALT 19  --   ALKPHOS 102  --   BILITOT 0.7  --     ------------------------------------------------------------------------------------------------------------------ estimated creatinine clearance is 37.5 mL/min (A) (by C-G formula based on SCr of 1.7 mg/dL (H)). ------------------------------------------------------------------------------------------------------------------ Recent Labs    08/18/17 1645  HGBA1C 8.5*   ------------------------------------------------------------------------------------------------------------------ No results for input(s): CHOL, HDL, LDLCALC, TRIG, CHOLHDL, LDLDIRECT in the last 72 hours. ------------------------------------------------------------------------------------------------------------------ No results for input(s): TSH, T4TOTAL, T3FREE, THYROIDAB in the last 72 hours.  Invalid input(s): FREET3 ------------------------------------------------------------------------------------------------------------------ No results for input(s): VITAMINB12, FOLATE, FERRITIN, TIBC, IRON, RETICCTPCT in the last 72 hours.  Coagulation profile No results for input(s): INR, PROTIME in the last 168 hours.  No results for input(s): DDIMER in the last 72 hours.  Cardiac Enzymes Recent Labs  Lab 08/18/17 0905  TROPONINI <0.03   ------------------------------------------------------------------------------------------------------------------ Invalid input(s): POCBNP   CBG: Recent Labs  Lab 08/18/17 1656 08/18/17 2104 08/19/17 0848 08/19/17 1152  GLUCAP 180* 228* 230* 264*       Studies: Dg Chest Portable 1 View  Result Date: 08/18/2017 CLINICAL DATA:  Pt to ER accompanied by son, reports acute onset shortness of breath this morning, at this time patient tripoding in triage, labored respirations 40+, EXAM: PORTABLE CHEST 1 VIEW COMPARISON:  None. FINDINGS: Cardiac silhouette is borderline enlarged. No mediastinal or hilar masses. Lungs show prominent bronchovascular markings, mild interstitial  thickening and hazy airspace opacity, the latter in the lower lungs. Probable pleural effusions. No pneumothorax. Skeletal structures are demineralized but grossly intact. IMPRESSION: 1. Findings consistent with congestive heart failure with interstitial and airspace pulmonary edema. Electronically Signed   By: Lajean Manes M.D.   On: 08/18/2017 09:34      Lab Results  Component Value Date   HGBA1C 8.5 (H) 08/18/2017   Lab Results  Component Value Date   CREATININE 1.70 (H) 08/19/2017       Scheduled Meds: . amLODipine  10 mg Oral Daily  . aspirin  81 mg Oral Daily  . enoxaparin (LOVENOX) injection  40 mg Subcutaneous Q24H  . furosemide  40 mg Intravenous Once  . insulin aspart  0-5 Units Subcutaneous QHS  . insulin aspart  0-9 Units Subcutaneous TID WC  . insulin glargine  12 Units Subcutaneous Daily  . labetalol  300 mg Oral BID  . sodium chloride flush  3 mL Intravenous Q12H   Continuous Infusions: . sodium chloride       LOS: 1 day    Time spent: >30 MINS    Reyne Dumas  [facility] Pager [phone]. If 7PM-7AM, please contact night-coverage at [email or web address], password [email or web address] 08/19/2017, 3:05 PM  LOS: 1 day

## 2017-08-20 ENCOUNTER — Inpatient Hospital Stay (HOSPITAL_COMMUNITY): Payer: Medicare (Managed Care)

## 2017-08-20 LAB — CBC
HCT: 31.6 % — ABNORMAL LOW (ref 36.0–46.0)
Hemoglobin: 10.2 g/dL — ABNORMAL LOW (ref 12.0–15.0)
MCH: 28.3 pg (ref 26.0–34.0)
MCHC: 32.3 g/dL (ref 30.0–36.0)
MCV: 87.5 fL (ref 78.0–100.0)
PLATELETS: 342 10*3/uL (ref 150–400)
RBC: 3.61 MIL/uL — ABNORMAL LOW (ref 3.87–5.11)
RDW: 14.4 % (ref 11.5–15.5)
WBC: 6.1 10*3/uL (ref 4.0–10.5)

## 2017-08-20 LAB — COMPREHENSIVE METABOLIC PANEL
ALT: 15 U/L (ref 14–54)
AST: 23 U/L (ref 15–41)
Albumin: 2.9 g/dL — ABNORMAL LOW (ref 3.5–5.0)
Alkaline Phosphatase: 87 U/L (ref 38–126)
Anion gap: 10 (ref 5–15)
BILIRUBIN TOTAL: 0.8 mg/dL (ref 0.3–1.2)
BUN: 33 mg/dL — AB (ref 6–20)
CO2: 24 mmol/L (ref 22–32)
CREATININE: 1.74 mg/dL — AB (ref 0.44–1.00)
Calcium: 8.9 mg/dL (ref 8.9–10.3)
Chloride: 104 mmol/L (ref 101–111)
GFR calc Af Amer: 33 mL/min — ABNORMAL LOW (ref >60)
GFR calc non Af Amer: 29 mL/min — ABNORMAL LOW (ref >60)
Glucose, Bld: 196 mg/dL — ABNORMAL HIGH (ref 65–99)
Potassium: 4.1 mmol/L (ref 3.5–5.1)
Sodium: 138 mmol/L (ref 135–145)
TOTAL PROTEIN: 6.5 g/dL (ref 6.5–8.1)

## 2017-08-20 LAB — GLUCOSE, CAPILLARY
GLUCOSE-CAPILLARY: 201 mg/dL — AB (ref 65–99)
GLUCOSE-CAPILLARY: 237 mg/dL — AB (ref 65–99)
GLUCOSE-CAPILLARY: 137 mg/dL — AB (ref 65–99)
Glucose-Capillary: 197 mg/dL — ABNORMAL HIGH (ref 65–99)

## 2017-08-20 LAB — PROCALCITONIN: Procalcitonin: 0.3 ng/mL

## 2017-08-20 MED ORDER — FUROSEMIDE 10 MG/ML IJ SOLN
40.0000 mg | Freq: Two times a day (BID) | INTRAMUSCULAR | Status: DC
  Administered 2017-08-20 (×2): 40 mg via INTRAVENOUS
  Filled 2017-08-20 (×2): qty 4

## 2017-08-20 MED ORDER — ATORVASTATIN CALCIUM 80 MG PO TABS
80.0000 mg | ORAL_TABLET | Freq: Every day | ORAL | Status: DC
  Administered 2017-08-20 – 2017-08-22 (×3): 80 mg via ORAL
  Filled 2017-08-20 (×3): qty 1

## 2017-08-20 MED ORDER — LOPERAMIDE HCL 2 MG PO CAPS
2.0000 mg | ORAL_CAPSULE | ORAL | Status: DC | PRN
  Administered 2017-08-20: 2 mg via ORAL
  Filled 2017-08-20: qty 1

## 2017-08-20 MED ORDER — VITAMIN D3 25 MCG (1000 UNIT) PO TABS
2000.0000 [IU] | ORAL_TABLET | Freq: Every day | ORAL | Status: DC
  Administered 2017-08-20 – 2017-08-22 (×3): 2000 [IU] via ORAL
  Filled 2017-08-20 (×6): qty 2

## 2017-08-20 NOTE — Progress Notes (Addendum)
PROGRESS NOTE    Charlotte Phelps  STM:[id number] DOB: 1946/03/19 DOA: 08/18/2017 PCP: Patient, No Pcp Per    Brief Narrative:  72 year old with past medical history relevant for coronary artery disease status post stent, heart failure of unclear etiology, type 2 diabetes on insulin, hypertension, hyperlipidemia, remote history of breast cancer status post surgery and chemo therapy admitted with acute hypoxic respiratory failure secondary to heart failure exacerbation.   Assessment & Plan:   Principal Problem:   Acute respiratory failure with hypoxia and hypercarbia (HCC) Active Problems:   Acute congestive heart failure (HCC)   HTN (hypertension)   HLD (hyperlipidemia)   Diabetes mellitus type 2 in obese [facility])   History of Breast cancer (2003)   Acute kidney injury ([place])   Anemia   #) Acute congestive heart failure exacerbation: Patient was recently discharged in [place] in February for same.  She had an echo done at that time.  Patient continues to be hypoxic with exertion however her lower extremity edema is improving. -Start furosemide 40 mg twice daily -Strict ins and outs, weigh daily, 2 g sodium restricted diet  #) AK I: Unclear baseline.  Suspect patient has a component of cardiorenal syndrome due to her fluid overload. -Hold nephrotoxins including arb -Diuresis  #) Coronary disease status post stent: -Continue atorvastatin 80 mg-continue aspirin 81 mg  #) Type 2 diabetes: Patient is on Tresiba at home -Continue sliding scale insulin -Continue glargine 12 units nightly  #) Hypertension: - Continue amlodipine 10 mg -Hold losartan 100 mg due to AK I -Continue labetalol 300 mg twice a day  Fluids: Restrict Electrodes: Monitor and supplement Nutrition: 2 g sodium restricted diet  Prophylaxis: Enoxaparin 40 mg  Disposition: Pending weaning to room air and improved hypoxia  Full code   Consultants:   None  Procedures: (Don't include imaging studies which  can be auto populated. Include things that cannot be auto populated i.e. Echo, Carotid and venous dopplers, Foley, Bipap, HD, tubes/drains, wound vac, central lines etc)  None  Antimicrobials: (specify start and planned stop date. Auto populated tables are space occupying and do not give end dates)  None   Subjective: Patient reports that she is doing better with decreased orthopnea and lower extremity edema.  Today while ambulating she began to have significant shortness of breath and was placed on 2 L oxygen however no clear hypoxia was documented.  She otherwise denies any chest pain, fevers, nausea, vomiting, diarrhea  Objective: Vitals:   08/19/17 1642 08/19/17 1940 08/19/17 2035 08/20/17 0300  BP: (!) 158/52 (!) 164/62  (!) 117/40  Pulse: 74  76   Resp: 20 19 20    Temp: 98.3 F (36.8 C) 98.5 F (36.9 C)  98.5 F (36.9 C)  TempSrc: Oral Oral  Oral  SpO2: 100%  100%   Weight:    104.4 kg (230 lb 1.6 oz)  Height:        Intake/Output Summary (Last 24 hours) at 08/20/2017 1009 Last data filed at 08/19/2017 2150 Gross per 24 hour  Intake 960 ml  Output 2200 ml  Net -1240 ml   Filed Weights   08/18/17 1612 08/19/17 0330 08/20/17 0300  Weight: 106.1 kg (233 lb 12.8 oz) 104.4 kg (230 lb 1.6 oz) 104.4 kg (230 lb 1.6 oz)    Examination:  General exam: Appears calm and comfortable  Respiratory system: Diminished lung sounds at bases, scattered rhonchi, crackles at bases Cardiovascular system: Regular rate and rhythm, 2 out of 6 systolic murmur  heard best at right upper sternal border Gastrointestinal system: Abdomen is nondistended, soft and nontender.Normal bowel sounds heard. Central nervous system: Alert and oriented. No focal neurological deficits. Extremities: Trace pedal edema Skin: No rashes on visible skin Psychiatry: Judgement and insight appear normal. Mood & affect appropriate.     Data Reviewed: I have personally reviewed following labs and imaging  studies  CBC: Recent Labs  Lab 08/18/17 0905 08/20/17 0350  WBC 9.7 6.1  NEUTROABS 5.8  --   HGB 10.8* 10.2*  HCT 33.8* 31.6*  MCV 88.7 87.5  PLT 392 151   Basic Metabolic Panel: Recent Labs  Lab 08/18/17 0905 08/19/17 0320 08/20/17 0350  NA 138 139 138  K 4.4 3.9 4.1  CL 105 104 104  CO2 20* 24 24  GLUCOSE 316* 200* 196*  BUN 29* 29* 33*  CREATININE 1.72* 1.70* 1.74*  CALCIUM 9.1 9.2 8.9   GFR: Estimated Creatinine Clearance: 36.6 mL/min (A) (by C-G formula based on SCr of 1.74 mg/dL (H)). Liver Function Tests: Recent Labs  Lab 08/18/17 0905 08/20/17 0350  AST 21 23  ALT 19 15  ALKPHOS 102 87  BILITOT 0.7 0.8  PROT 8.3* 6.5  ALBUMIN 3.5 2.9*   No results for input(s): LIPASE, AMYLASE in the last 168 hours. No results for input(s): AMMONIA in the last 168 hours. Coagulation Profile: No results for input(s): INR, PROTIME in the last 168 hours. Cardiac Enzymes: Recent Labs  Lab 08/18/17 0905  TROPONINI <0.03   BNP (last 3 results) No results for input(s): PROBNP in the last 8760 hours. HbA1C: Recent Labs    08/18/17 1645  HGBA1C 8.5*   CBG: Recent Labs  Lab 08/19/17 0848 08/19/17 1152 08/19/17 1640 08/19/17 2057 08/20/17 0737  GLUCAP 230* 264* 322* 327* 201*   Lipid Profile: No results for input(s): CHOL, HDL, LDLCALC, TRIG, CHOLHDL, LDLDIRECT in the last 72 hours. Thyroid Function Tests: No results for input(s): TSH, T4TOTAL, FREET4, T3FREE, THYROIDAB in the last 72 hours. Anemia Panel: No results for input(s): VITAMINB12, FOLATE, FERRITIN, TIBC, IRON, RETICCTPCT in the last 72 hours. Sepsis Labs: No results for input(s): PROCALCITON, LATICACIDVEN in the last 168 hours.  Recent Results (from the past 240 hour(s))  Respiratory Panel by PCR     Status: None   Collection Time: 08/18/17  6:08 PM  Result Value Ref Range Status   Adenovirus NOT DETECTED NOT DETECTED Final   Coronavirus 229E NOT DETECTED NOT DETECTED Final   Coronavirus  HKU1 NOT DETECTED NOT DETECTED Final   Coronavirus NL63 NOT DETECTED NOT DETECTED Final   Coronavirus OC43 NOT DETECTED NOT DETECTED Final   Metapneumovirus NOT DETECTED NOT DETECTED Final   Rhinovirus / Enterovirus NOT DETECTED NOT DETECTED Final   Influenza A NOT DETECTED NOT DETECTED Final   Influenza B NOT DETECTED NOT DETECTED Final   Parainfluenza Virus 1 NOT DETECTED NOT DETECTED Final   Parainfluenza Virus 2 NOT DETECTED NOT DETECTED Final   Parainfluenza Virus 3 NOT DETECTED NOT DETECTED Final   Parainfluenza Virus 4 NOT DETECTED NOT DETECTED Final   Respiratory Syncytial Virus NOT DETECTED NOT DETECTED Final   Bordetella pertussis NOT DETECTED NOT DETECTED Final   Chlamydophila pneumoniae NOT DETECTED NOT DETECTED Final   Mycoplasma pneumoniae NOT DETECTED NOT DETECTED Final    Comment: Performed at [facility], [address], [place], [place] [address]         Radiology Studies: No results found.      Scheduled  Meds: . amLODipine  10 mg Oral Daily  . aspirin  81 mg Oral Daily  . enoxaparin (LOVENOX) injection  40 mg Subcutaneous Q24H  . insulin aspart  0-5 Units Subcutaneous QHS  . insulin aspart  0-9 Units Subcutaneous TID WC  . insulin glargine  12 Units Subcutaneous Daily  . labetalol  300 mg Oral BID  . sodium chloride flush  3 mL Intravenous Q12H   Continuous Infusions: . sodium chloride       LOS: 2 days    Time spent: [place], MD [facility]  If 7PM-7AM, please contact night-coverage [email or web address] Password [email or web address] 08/20/2017, 10:09 AM

## 2017-08-20 NOTE — Progress Notes (Signed)
Inpatient Diabetes Program Recommendations  AACE/ADA: New Consensus Statement on Inpatient Glycemic Control (2015)  Target Ranges:  Prepandial:   less than 140 mg/dL      Peak postprandial:   less than 180 mg/dL (1-2 hours)      Critically ill patients:  140 - 180 mg/dL   Lab Results  Component Value Date   GLUCAP 201 (H) 08/20/2017   HGBA1C 8.5 (H) 08/18/2017    Review of Glycemic Control Results for Charlotte Phelps, Charlotte Phelps (MRN [id number]) as of 08/20/2017 09:59  Ref. Range 08/19/2017 11:52 08/19/2017 16:40 08/19/2017 20:57 08/20/2017 07:37  Glucose-Capillary Latest Ref Range: 65 - 99 mg/dL 264 (H) 322 (H) 327 (H) 201 (H)    Diabetes history: DM 2 Outpatient Diabetes medications: Tresiba 35-60 units, Novolog 35 units tid Current orders for Inpatient glycemic control: Novolog Sensitive Correction 0-9 units tid + Novolog HS scale 0-5 units, Lantus 12 Units QD   Inpatient Diabetes Program Recommendations:    Consider starting Novolog 5 Units TIDAC (when patient consumes >50% of meal).  Thanks, Bronson Curb, MSN, RNC-OB Diabetes Coordinator [phone] (8a-5p)

## 2017-08-20 NOTE — Progress Notes (Signed)
Pt spiked temp of 102.3. Pt denies pain or any symptoms of illness. Dr. Herbert Moors notified. New orders given. WCTM.

## 2017-08-21 ENCOUNTER — Inpatient Hospital Stay (HOSPITAL_COMMUNITY): Payer: Medicare (Managed Care)

## 2017-08-21 DIAGNOSIS — I2510 Atherosclerotic heart disease of native coronary artery without angina pectoris: Secondary | ICD-10-CM

## 2017-08-21 LAB — GLUCOSE, CAPILLARY
GLUCOSE-CAPILLARY: 159 mg/dL — AB (ref 65–99)
GLUCOSE-CAPILLARY: 238 mg/dL — AB (ref 65–99)
GLUCOSE-CAPILLARY: 221 mg/dL — AB (ref 65–99)
GLUCOSE-CAPILLARY: 293 mg/dL — AB (ref 65–99)

## 2017-08-21 LAB — BASIC METABOLIC PANEL
Anion gap: 11 (ref 5–15)
BUN: 36 mg/dL — AB (ref 6–20)
CO2: 23 mmol/L (ref 22–32)
Calcium: 8.6 mg/dL — ABNORMAL LOW (ref 8.9–10.3)
Chloride: 101 mmol/L (ref 101–111)
Creatinine, Ser: 1.97 mg/dL — ABNORMAL HIGH (ref 0.44–1.00)
GFR calc Af Amer: 29 mL/min — ABNORMAL LOW (ref >60)
GFR, EST NON AFRICAN AMERICAN: 25 mL/min — AB (ref >60)
Glucose, Bld: 182 mg/dL — ABNORMAL HIGH (ref 65–99)
POTASSIUM: 3.6 mmol/L (ref 3.5–5.1)
SODIUM: 135 mmol/L (ref 135–145)

## 2017-08-21 LAB — PROCALCITONIN: Procalcitonin: 0.5 ng/mL

## 2017-08-21 LAB — CBC
HCT: 30.8 % — ABNORMAL LOW (ref 36.0–46.0)
Hemoglobin: 9.9 g/dL — ABNORMAL LOW (ref 12.0–15.0)
MCH: 27.9 pg (ref 26.0–34.0)
MCHC: 32.1 g/dL (ref 30.0–36.0)
MCV: 86.8 fL (ref 78.0–100.0)
Platelets: 322 K/uL (ref 150–400)
RBC: 3.55 MIL/uL — ABNORMAL LOW (ref 3.87–5.11)
RDW: 14.5 % (ref 11.5–15.5)
WBC: 5.2 K/uL (ref 4.0–10.5)

## 2017-08-21 LAB — MAGNESIUM: Magnesium: 1.8 mg/dL (ref 1.7–2.4)

## 2017-08-21 MED ORDER — TECHNETIUM TC 99M DIETHYLENETRIAME-PENTAACETIC ACID
32.0000 | Freq: Once | INTRAVENOUS | Status: AC | PRN
  Administered 2017-08-21: 32 via RESPIRATORY_TRACT

## 2017-08-21 MED ORDER — FUROSEMIDE 80 MG PO TABS
80.0000 mg | ORAL_TABLET | Freq: Two times a day (BID) | ORAL | Status: DC
  Administered 2017-08-21 – 2017-08-22 (×4): 80 mg via ORAL
  Filled 2017-08-21 (×4): qty 1

## 2017-08-21 MED ORDER — TECHNETIUM TO 99M ALBUMIN AGGREGATED
4.0000 | Freq: Once | INTRAVENOUS | Status: AC | PRN
  Administered 2017-08-21: 4 via INTRAVENOUS

## 2017-08-21 NOTE — Progress Notes (Signed)
   08/21/17 1400  Clinical Encounter Type  Visited With Patient  Visit Type Initial  Referral From Chaplain  Consult/Referral To Chaplain  Spiritual Encounters  Spiritual Needs Emotional  Stress Factors  Patient Stress Factors Exhausted  Family Stress Factors None identified    Pt was sitting in her chair watching television. Thre was no amily member at the time. Pt talked a bout family and how she wished to get well soon to be able to go home and take care of her father. Chaplain provided emotional support through reflective listening and compassionate presence.  Manda Holstad a Medical sales representative, [employer]

## 2017-08-21 NOTE — Progress Notes (Signed)
PROGRESS NOTE  Sharyah Bostwick WUJ:[id number] DOB: [birth year] DOA: 08/18/2017 PCP: Patient, No Pcp Per  HPI/Recap of past 24 hours:  Evelean Bigler is a 72 y.o. year old female with medical history significant for CAD, ischemic cardia myopathy, CHF, retention, type 2 diabetes who presented on 08/18/2017 with cough and worsening dyspnea and was found to have acute exacerbation of CHF previously requiring BiPAP.  Now off vasopressor oxygen.  Repeat chest x-ray has shown improvement in interstitial edema.  Patient is down 3.5 L net negative since admission.  Currently back at dry weight of 229 pounds.    Interval History Admits to nonproductive cough particularly while walking.  Improvement in lower leg swelling.  Assessment/Plan: Principal Problem:   Acute respiratory failure with hypoxia and hypercarbia (HCC) Active Problems:   Acute congestive heart failure (HCC)   HTN (hypertension)   HLD (hyperlipidemia)   Diabetes mellitus type 2 in obese [facility])   History of Breast cancer (2003)   Acute kidney injury ([place])   Anemia   CAD in native artery   #Acute CHF exacerbation, improving Maintained normal oxygen saturation on walking o2 test -Transition from IV Lasix to p.o. Lasix 80 mg twice daily -Plan for DC on 3/7 if continues to improve oral regimen -Low-sodium diet, daily weights, fluid restriction  #Ischemic cardiomyopathy Stent placed in [place] in 2017 per patient. No chest pain currently - Home atorvastatin, aspirin  #Acute renal insufficiency, unclear baseline 1.7 on admission, 1.97 on today Possible cardiorenal syndrome -Expect improvement with continued diuresis -Holding home losartan - If worsens in next 24 hours will obtain further workup and renal consultation  #Type 2 diabetes A1c 8.5 (08/2017) -Holding home Tyler Aas -Lantus 12 units, correction coverage  #Hypertension, controlled -Continue amlodipine  Code Status: Full  Family Communication: Daughter at  bedside  Disposition Plan: Plan for DC in 24 hours pending clinical improvement  Consultants:  None  Procedures:  None  Antimicrobials:  None  Cultures:  None  Telemetry:  DVT prophylaxis: Lovenox   Objective: Vitals:   08/20/17 1402 08/20/17 2318 08/21/17 0513 08/21/17 1427  BP: (!) 140/54  (!) 146/48 (!) 119/49  Pulse:  70 64 65  Resp:  16 18 16   Temp: (!) 102.3 F (39.1 C)  98.7 F (37.1 C) 98.4 F (36.9 C)  TempSrc: Oral  Oral Oral  SpO2: 100% 100% 100% 100%  Weight:   104.1 kg (229 lb 8 oz)   Height:        Intake/Output Summary (Last 24 hours) at 08/21/2017 1532 Last data filed at 08/21/2017 0500 Gross per 24 hour  Intake 840 ml  Output 500 ml  Net 340 ml   Filed Weights   08/19/17 0330 08/20/17 0300 08/21/17 0513  Weight: 104.4 kg (230 lb 1.6 oz) 104.4 kg (230 lb 1.6 oz) 104.1 kg (229 lb 8 oz)    Exam:  General: Lying in bed,no apparent distres Eyes: EOMI, anicteric ENT: Oral Mucosa clear and moist Neck: normal, no thyromegaly Cardiovascular: regular rate and rhythm, no murmurs, rubs or gallops, no JVD or edema Respiratory: Normal respiratory effort on room air, lungs clear to auscultation bilaterally Abdomen: soft, non-distended, non-tender, normal bowel sounds Skin: No Rash Musculoskeletal:Good ROM, no contractures. Normal muscle tone Neurologic: Grossly no focal neuro deficit.Mental status AAOx3 Psychiatric:Appropriate affect, and mood  Data Reviewed: CBC: Recent Labs  Lab 08/18/17 0905 08/20/17 0350 08/21/17 0403  WBC 9.7 6.1 5.2  NEUTROABS 5.8  --   --   HGB 10.8* 10.2*  9.9*  HCT 33.8* 31.6* 30.8*  MCV 88.7 87.5 86.8  PLT 392 342 627   Basic Metabolic Panel: Recent Labs  Lab 08/18/17 0905 08/19/17 0320 08/20/17 0350 08/21/17 0403  NA 138 139 138 135  K 4.4 3.9 4.1 3.6  CL 105 104 104 101  CO2 20* 24 24 23   GLUCOSE 316* 200* 196* 182*  BUN 29* 29* 33* 36*  CREATININE 1.72* 1.70* 1.74* 1.97*  CALCIUM 9.1 9.2 8.9  8.6*  MG  --   --   --  1.8   GFR: Estimated Creatinine Clearance: 32.3 mL/min (A) (by C-G formula based on SCr of 1.97 mg/dL (H)). Liver Function Tests: Recent Labs  Lab 08/18/17 0905 08/20/17 0350  AST 21 23  ALT 19 15  ALKPHOS 102 87  BILITOT 0.7 0.8  PROT 8.3* 6.5  ALBUMIN 3.5 2.9*   No results for input(s): LIPASE, AMYLASE in the last 168 hours. No results for input(s): AMMONIA in the last 168 hours. Coagulation Profile: No results for input(s): INR, PROTIME in the last 168 hours. Cardiac Enzymes: Recent Labs  Lab 08/18/17 0905  TROPONINI <0.03   BNP (last 3 results) No results for input(s): PROBNP in the last 8760 hours. HbA1C: Recent Labs    08/18/17 1645  HGBA1C 8.5*   CBG: Recent Labs  Lab 08/20/17 1111 08/20/17 1635 08/20/17 2101 08/21/17 0806 08/21/17 1056  GLUCAP 237* 137* 197* 159* 238*   Lipid Profile: No results for input(s): CHOL, HDL, LDLCALC, TRIG, CHOLHDL, LDLDIRECT in the last 72 hours. Thyroid Function Tests: No results for input(s): TSH, T4TOTAL, FREET4, T3FREE, THYROIDAB in the last 72 hours. Anemia Panel: No results for input(s): VITAMINB12, FOLATE, FERRITIN, TIBC, IRON, RETICCTPCT in the last 72 hours. Urine analysis: No results found for: COLORURINE, APPEARANCEUR, LABSPEC, PHURINE, GLUCOSEU, HGBUR, BILIRUBINUR, KETONESUR, PROTEINUR, UROBILINOGEN, NITRITE, LEUKOCYTESUR Sepsis Labs: @LABRCNTIP (procalcitonin:4,lacticidven:4)  ) Recent Results (from the past 240 hour(s))  Respiratory Panel by PCR     Status: None   Collection Time: 08/18/17  6:08 PM  Result Value Ref Range Status   Adenovirus NOT DETECTED NOT DETECTED Final   Coronavirus 229E NOT DETECTED NOT DETECTED Final   Coronavirus HKU1 NOT DETECTED NOT DETECTED Final   Coronavirus NL63 NOT DETECTED NOT DETECTED Final   Coronavirus OC43 NOT DETECTED NOT DETECTED Final   Metapneumovirus NOT DETECTED NOT DETECTED Final   Rhinovirus / Enterovirus NOT DETECTED NOT DETECTED  Final   Influenza A NOT DETECTED NOT DETECTED Final   Influenza B NOT DETECTED NOT DETECTED Final   Parainfluenza Virus 1 NOT DETECTED NOT DETECTED Final   Parainfluenza Virus 2 NOT DETECTED NOT DETECTED Final   Parainfluenza Virus 3 NOT DETECTED NOT DETECTED Final   Parainfluenza Virus 4 NOT DETECTED NOT DETECTED Final   Respiratory Syncytial Virus NOT DETECTED NOT DETECTED Final   Bordetella pertussis NOT DETECTED NOT DETECTED Final   Chlamydophila pneumoniae NOT DETECTED NOT DETECTED Final   Mycoplasma pneumoniae NOT DETECTED NOT DETECTED Final    Comment: Performed at [facility], [place] [address], [place], [place] [address]  Culture, blood (routine x 2)     Status: None (Preliminary result)   Collection Time: 08/20/17  3:50 PM  Result Value Ref Range Status   Specimen Description BLOOD RIGHT HAND  Final   Special Requests IN PEDIATRIC BOTTLE Blood Culture adequate volume  Final   Culture   Final    NO GROWTH < 24 HOURS Performed at [facility], [address], [place], [place] [address]    Report Status PENDING  Incomplete  Culture, blood (routine x 2)     Status: None (Preliminary result)   Collection Time: 08/20/17  4:08 PM  Result Value Ref Range Status   Specimen Description BLOOD LEFT ANTECUBITAL  Final   Special Requests IN PEDIATRIC BOTTLE Blood Culture adequate volume  Final   Culture   Final    NO GROWTH < 24 HOURS Performed at [facility], [place] [address], [place], [place] [address]    Report Status PENDING  Incomplete      Studies: Nm Pulmonary Perf And Vent  Result Date: 08/21/2017 CLINICAL DATA:  History of interstitial lung disease. Heart failure and type 2 diabetes. EXAM: NUCLEAR MEDICINE VENTILATION - PERFUSION LUNG SCAN TECHNIQUE: Ventilation images were obtained in multiple projections using inhaled aerosol Tc-41m DTPA. Perfusion images were obtained in multiple projections after intravenous injection of Tc-2m-MAA. RADIOPHARMACEUTICALS:   Thirty-one mCi of Tc-50m DTPA aerosol inhalation and 4.2 mCi Tc31m-MAA IV COMPARISON:  Chest radiograph from 08/20/2016. FINDINGS: Ventilation: No focal ventilation defect. Perfusion: No wedge shaped peripheral perfusion defects to suggest acute pulmonary embolism. IMPRESSION: No evidence for pulmonary embolus. Electronically Signed   By: Kerby Moors M.D.   On: 08/21/2017 10:26    Scheduled Meds: . amLODipine  10 mg Oral Daily  . aspirin  81 mg Oral Daily  . atorvastatin  80 mg Oral Daily  . cholecalciferol  2,000 Units Oral Daily  . enoxaparin (LOVENOX) injection  40 mg Subcutaneous Q24H  . furosemide  80 mg Oral BID  . insulin aspart  0-5 Units Subcutaneous QHS  . insulin aspart  0-9 Units Subcutaneous TID WC  . insulin glargine  12 Units Subcutaneous Daily  . labetalol  300 mg Oral BID  . sodium chloride flush  3 mL Intravenous Q12H    Continuous Infusions: . sodium chloride       LOS: 3 days     Desiree Hane, MD [facility] Pager [phone]  If 7PM-7AM, please contact night-coverage [email or web address] Password [facility] 08/21/2017, 3:32 PM

## 2017-08-21 NOTE — Progress Notes (Signed)
Patient placed on CPAP at this time, no distress noted at this time.

## 2017-08-21 NOTE — Progress Notes (Signed)
SATURATION QUALIFICATIONS: (This note is used to comply with regulatory documentation for home oxygen)  Patient Saturations on Room Air at Rest = 94*%  Patient Saturations on Room Air while Ambulating = 90%   Patient Saturations on 0 Liters of oxygen while Ambulating = 90%  Please briefly explain why patient needs home oxygen: Pt does not qualify for home O2

## 2017-08-22 DIAGNOSIS — J810 Acute pulmonary edema: Secondary | ICD-10-CM

## 2017-08-22 DIAGNOSIS — I509 Heart failure, unspecified: Secondary | ICD-10-CM

## 2017-08-22 DIAGNOSIS — E669 Obesity, unspecified: Secondary | ICD-10-CM

## 2017-08-22 DIAGNOSIS — I2510 Atherosclerotic heart disease of native coronary artery without angina pectoris: Secondary | ICD-10-CM

## 2017-08-22 DIAGNOSIS — C50919 Malignant neoplasm of unspecified site of unspecified female breast: Secondary | ICD-10-CM

## 2017-08-22 DIAGNOSIS — I10 Essential (primary) hypertension: Secondary | ICD-10-CM

## 2017-08-22 DIAGNOSIS — E1169 Type 2 diabetes mellitus with other specified complication: Secondary | ICD-10-CM

## 2017-08-22 LAB — BASIC METABOLIC PANEL
Anion gap: 11 (ref 5–15)
BUN: 42 mg/dL — AB (ref 6–20)
CO2: 23 mmol/L (ref 22–32)
CREATININE: 1.91 mg/dL — AB (ref 0.44–1.00)
Calcium: 9 mg/dL (ref 8.9–10.3)
Chloride: 103 mmol/L (ref 101–111)
GFR calc Af Amer: 30 mL/min — ABNORMAL LOW (ref >60)
GFR, EST NON AFRICAN AMERICAN: 26 mL/min — AB (ref >60)
Glucose, Bld: 216 mg/dL — ABNORMAL HIGH (ref 65–99)
Potassium: 3.9 mmol/L (ref 3.5–5.1)
SODIUM: 137 mmol/L (ref 135–145)

## 2017-08-22 LAB — GLUCOSE, CAPILLARY
Glucose-Capillary: 190 mg/dL — ABNORMAL HIGH (ref 65–99)
Glucose-Capillary: 224 mg/dL — ABNORMAL HIGH (ref 65–99)
Glucose-Capillary: 277 mg/dL — ABNORMAL HIGH (ref 65–99)

## 2017-08-22 LAB — PROCALCITONIN: Procalcitonin: 0.43 ng/mL

## 2017-08-22 MED ORDER — FUROSEMIDE 40 MG PO TABS: 80.0000 mg | ORAL_TABLET | Freq: Two times a day (BID) | ORAL | 0 refills | Status: AC

## 2017-08-22 MED ORDER — LABETALOL HCL 300 MG PO TABS: 300.0000 mg | ORAL_TABLET | Freq: Two times a day (BID) | ORAL | 0 refills | Status: AC

## 2017-08-22 NOTE — Progress Notes (Signed)
Inpatient Diabetes Program Recommendations  AACE/ADA: New Consensus Statement on Inpatient Glycemic Control (2015)  Target Ranges:  Prepandial:   less than 140 mg/dL      Peak postprandial:   less than 180 mg/dL (1-2 hours)      Critically ill patients:  140 - 180 mg/dL   Lab Results  Component Value Date   GLUCAP 190 (H) 08/22/2017   HGBA1C 8.5 (H) 08/18/2017    Review of Glycemic Control Results for FLORABELLE, CARDIN (MRN [id number]) as of 08/22/2017 09:16  Ref. Range 08/21/2017 10:56 08/21/2017 16:10 08/21/2017 21:32 08/22/2017 04:51 08/22/2017 08:00  Glucose-Capillary Latest Ref Range: 65 - 99 mg/dL 238 (H) 221 (H) 293 (H)  190 (H)   Diabetes history:DM 2 Outpatient Diabetes medications:Tresiba 35-60 units, Novolog 35 units tid Current orders for Inpatient glycemic control:Novolog Sensitive Correction 0-9 units tid + Novolog HS scale 0-5 units, Lantus 12 Units QD    Inpatient Diabetes Program Recommendations:     Post prandials continue to be >180 mg/dL consider starting Novolog 3 Units TIDAC (when patient consumes >50% of meal). Patient, per med rec, takes Home.   Thanks, Bronson Curb, MSN, RNC-OB Diabetes Coordinator [phone] (8a-5p)

## 2017-08-22 NOTE — Discharge Summary (Addendum)
Discharge Summary  Charlotte Phelps GNF:[id number] DOB: 1946/05/31  PCP: Patient, No Pcp Per  Admit date: 08/18/2017 Discharge date: 08/22/2017  Time spent: < 25 minutes  Admitted From: Home Disposition: Home  Recommendations for Outpatient Follow-up:  1. Follow up with PCP in 1-2 weeks.  Check BMP (renal function) and fluid status on increased Lasix dose. 2. Hold home losartan until evaluated by PCP with repeat BMP in 1 week Home Health: No Equipment/Devices: No  Discharge Diagnoses:  Active Hospital Problems   Diagnosis Date Noted  . Acute respiratory failure with hypoxia and hypercarbia ([place]) 08/18/2017  . CAD in native artery 08/21/2017  . Acute congestive heart failure ([place]) 08/18/2017  . HTN (hypertension) 08/18/2017  . HLD (hyperlipidemia) 08/18/2017  . Diabetes mellitus type 2 in obese ([place]) 08/18/2017  . History of Breast cancer (2003) 08/18/2017  . Acute kidney injury ([place]) 08/18/2017  . Anemia 08/18/2017    Resolved Hospital Problems  No resolved problems to display.    Discharge Condition: Stable  CODE STATUS: Full Diet recommendation: Heart Healthy  Vitals:   08/22/17 0449 08/22/17 0858  BP: (!) 145/60 (!) 146/40  Pulse: 66 66  Resp: 16   Temp: 98.2 F (36.8 C)   SpO2: 100%     History of present illness:  Charlotte Phelps is a 72 y.o. year old female with medical history significant for significant for CAD, ischemic cardiomyopathy, CHF, history of breast cancer,  type 2 diabetes who presented on 08/18/2017 with cough and worsening dyspnea and was found to have acute exacerbation of CHF.    Hospital Course:  Principal Problem:   Acute respiratory failure with hypoxia and hypercarbia (HCC) Active Problems:   Acute congestive heart failure (HCC)   HTN (hypertension)   HLD (hyperlipidemia)   Diabetes mellitus type 2 in obese [facility])   History of Breast cancer (2003)   Acute kidney injury ([place])   Anemia   CAD in native artery   #Acute hypoxic  respiratory failure related to acute CHF exacerbation Presented with tachypnea, hypoxia requiring 2 L oxygen, chest x-ray showing pulmonary edema, BNP greater than 800 with significant respiratory distress requiring and transient BiPAP therapy. Responded to IV Lasix 80 mg, transitioned to oral 80 mg twice daily with continued response prior to discharge -On walking oxygen test required no supplemental oxygen prior to discharge  #Acute on chronic CHF exacerbation History of ischemic cardiomyopathy, status post stent in 2017 Last admission for exacerbation in February Followed outpatient in [place], no repeat TTE obtained here -3 L during admission -BNP > 800, pulmonary edema on CXR, hypoxic during admission -Improved with IV Lasix, continued good diuresis with oral transition -Continue Lasix 80 mg twice daily on discharge -Weight 229 pounds on discharge -Continue home aspirin and atorvastatin on discharge  #AKI suspect CKD Patient unclear of baseline creatinine, but reports history of chronic kidney dysfunction -Creatinine 1.9 on discharge - Will need PCP follow-up in [place] (home)to continue to monitor -Instructed to hold home losartan until evaluated by primary care doctor   Antibiotics: None  Microbiology: None   Consultations:  None   Procedures/Studies:  Nm Pulmonary Perf And Vent  Result Date: 08/21/2017 CLINICAL DATA:  History of interstitial lung disease. Heart failure and type 2 diabetes. EXAM: NUCLEAR MEDICINE VENTILATION - PERFUSION LUNG SCAN TECHNIQUE: Ventilation images were obtained in multiple projections using inhaled aerosol Tc-39m DTPA. Perfusion images were obtained in multiple projections after intravenous injection of Tc-76m-MAA. RADIOPHARMACEUTICALS:  Thirty-one mCi of Tc-64m DTPA aerosol inhalation and  4.2 mCi Tc14m-MAA IV COMPARISON:  Chest radiograph from 08/20/2016. FINDINGS: Ventilation: No focal ventilation defect. Perfusion: No wedge shaped peripheral  perfusion defects to suggest acute pulmonary embolism. IMPRESSION: No evidence for pulmonary embolus. Electronically Signed   By: Kerby Moors M.D.   On: 08/21/2017 10:26   Dg Chest Port 1 View  Result Date: 08/20/2017 CLINICAL DATA:  Hypoxia.  History of breast cancer. EXAM: PORTABLE CHEST 1 VIEW COMPARISON:  Single-view of the chest 08/18/2017. FINDINGS: Cardiomegaly is again seen. Pulmonary edema present on the prior examination has markedly improved. No pneumothorax or pleural effusion. Aortic atherosclerosis is seen. The patient is status post right axillary dissection. IMPRESSION: Marked improvement in pulmonary edema.  No new abnormality. Electronically Signed   By: Inge Rise M.D.   On: 08/20/2017 11:03   Dg Chest Portable 1 View  Result Date: 08/18/2017 CLINICAL DATA:  Pt to ER accompanied by son, reports acute onset shortness of breath this morning, at this time patient tripoding in triage, labored respirations 40+, EXAM: PORTABLE CHEST 1 VIEW COMPARISON:  None. FINDINGS: Cardiac silhouette is borderline enlarged. No mediastinal or hilar masses. Lungs show prominent bronchovascular markings, mild interstitial thickening and hazy airspace opacity, the latter in the lower lungs. Probable pleural effusions. No pneumothorax. Skeletal structures are demineralized but grossly intact. IMPRESSION: 1. Findings consistent with congestive heart failure with interstitial and airspace pulmonary edema. Electronically Signed   By: Lajean Manes M.D.   On: 08/18/2017 09:34      Discharge Exam: BP (!) 146/40   Pulse 66   Temp 98.2 F (36.8 C) (Oral)   Resp 16   Ht 5\' 5"  (1.651 m)   Wt 103.2 kg (227 lb 8 oz)   SpO2 100%   BMI 37.86 kg/m   General: Lying in bed, no apparent distress Eyes: EOMI, anicteric ENT: Oral Mucosa clear and moist Cardiovascular: regular rate and rhythm, no murmurs, rubs or gallops, , no edema, no JVD Respiratory: Normal respiratory effort on room air, lungs clear to  auscultation bilaterally Abdomen: soft, non-distended, non-tender, normal bowel sounds Skin: No Rash Musculoskeletal:Good ROM, no contractures. Normal muscle tone Neurologic: Grossly no focal neuro deficit.Mental status AAOx3, Psychiatric:Appropriate affect, and mood   Discharge Instructions You were cared for by a hospitalist during your hospital stay. If you have any questions about your discharge medications or the care you received while you were in the hospital after you are discharged, you can call the unit and asked to speak with the hospitalist on call if the hospitalist that took care of you is not available. Once you are discharged, your primary care physician will handle any further medical issues. Please note that NO REFILLS for any discharge medications will be authorized once you are discharged, as it is imperative that you return to your primary care physician (or establish a relationship with a primary care physician if you do not have one) for your aftercare needs so that they can reassess your need for medications and monitor your lab values.  Discharge Instructions    Diet - low sodium heart healthy   Complete by:  As directed    Increase activity slowly   Complete by:  As directed      Allergies as of 08/22/2017      Reactions   Lisinopril Other (See Comments)   Watery Eyes   Sulfa Antibiotics    unknown      Medication List    STOP taking these medications   losartan 100  MG tablet Commonly known as:  COZAAR     TAKE these medications   acetaminophen 500 MG tablet Commonly known as:  TYLENOL Take 1,000 mg by mouth every 6 (six) hours as needed for mild pain.   amLODipine 10 MG tablet Commonly known as:  NORVASC Take 10 mg by mouth daily.   aspirin EC 81 MG tablet Take 81 mg by mouth daily.   atorvastatin 80 MG tablet Commonly known as:  LIPITOR Take 80 mg by mouth daily.   CORICIDIN HBP CONGESTION/COUGH 10-200 MG Caps Generic drug:   Dextromethorphan-guaiFENesin Take 2 tablets by mouth as needed (Congestion and Cough).   furosemide 40 MG tablet Commonly known as:  LASIX Take 2 tablets (80 mg total) by mouth 2 (two) times daily. What changed:    medication strength  how much to take  when to take this   labetalol 300 MG tablet Commonly known as:  NORMODYNE Take 1 tablet (300 mg total) by mouth 2 (two) times daily.   MUCUS RELIEF ADULT PO Take 1 tablet by mouth 2 (two) times daily.   NOVOLOG FLEXPEN 100 UNIT/ML FlexPen Generic drug:  insulin aspart Inject 35 Units into the skin 3 (three) times daily with meals.   TRESIBA FLEXTOUCH 100 UNIT/ML Sopn FlexTouch Pen Generic drug:  insulin degludec Inject 35-60 Units into the skin daily. Sliding Scale   VENTOLIN HFA 108 (90 Base) MCG/ACT inhaler Generic drug:  albuterol Inhale 2 puffs into the lungs every 6 (six) hours as needed for shortness of breath or wheezing.   Vitamin D3 5000 units Caps Take 10,000 Units by mouth daily.   WOMENS 50+ ADVANCED PO Take 1 tablet by mouth daily.      Allergies  Allergen Reactions  . Lisinopril Other (See Comments)    Watery Eyes  . Sulfa Antibiotics     unknown      The results of significant diagnostics from this hospitalization (including imaging, microbiology, ancillary and laboratory) are listed below for reference.    Significant Diagnostic Studies: Nm Pulmonary Perf And Vent  Result Date: 08/21/2017 CLINICAL DATA:  History of interstitial lung disease. Heart failure and type 2 diabetes. EXAM: NUCLEAR MEDICINE VENTILATION - PERFUSION LUNG SCAN TECHNIQUE: Ventilation images were obtained in multiple projections using inhaled aerosol Tc-87m DTPA. Perfusion images were obtained in multiple projections after intravenous injection of Tc-40m-MAA. RADIOPHARMACEUTICALS:  Thirty-one mCi of Tc-10m DTPA aerosol inhalation and 4.2 mCi Tc18m-MAA IV COMPARISON:  Chest radiograph from 08/20/2016. FINDINGS: Ventilation: No  focal ventilation defect. Perfusion: No wedge shaped peripheral perfusion defects to suggest acute pulmonary embolism. IMPRESSION: No evidence for pulmonary embolus. Electronically Signed   By: Kerby Moors M.D.   On: 08/21/2017 10:26   Dg Chest Port 1 View  Result Date: 08/20/2017 CLINICAL DATA:  Hypoxia.  History of breast cancer. EXAM: PORTABLE CHEST 1 VIEW COMPARISON:  Single-view of the chest 08/18/2017. FINDINGS: Cardiomegaly is again seen. Pulmonary edema present on the prior examination has markedly improved. No pneumothorax or pleural effusion. Aortic atherosclerosis is seen. The patient is status post right axillary dissection. IMPRESSION: Marked improvement in pulmonary edema.  No new abnormality. Electronically Signed   By: Inge Rise M.D.   On: 08/20/2017 11:03   Dg Chest Portable 1 View  Result Date: 08/18/2017 CLINICAL DATA:  Pt to ER accompanied by son, reports acute onset shortness of breath this morning, at this time patient tripoding in triage, labored respirations 40+, EXAM: PORTABLE CHEST 1 VIEW COMPARISON:  None. FINDINGS: Cardiac  silhouette is borderline enlarged. No mediastinal or hilar masses. Lungs show prominent bronchovascular markings, mild interstitial thickening and hazy airspace opacity, the latter in the lower lungs. Probable pleural effusions. No pneumothorax. Skeletal structures are demineralized but grossly intact. IMPRESSION: 1. Findings consistent with congestive heart failure with interstitial and airspace pulmonary edema. Electronically Signed   By: Lajean Manes M.D.   On: 08/18/2017 09:34    Microbiology: Recent Results (from the past 240 hour(s))  Respiratory Panel by PCR     Status: None   Collection Time: 08/18/17  6:08 PM  Result Value Ref Range Status   Adenovirus NOT DETECTED NOT DETECTED Final   Coronavirus 229E NOT DETECTED NOT DETECTED Final   Coronavirus HKU1 NOT DETECTED NOT DETECTED Final   Coronavirus NL63 NOT DETECTED NOT DETECTED Final    Coronavirus OC43 NOT DETECTED NOT DETECTED Final   Metapneumovirus NOT DETECTED NOT DETECTED Final   Rhinovirus / Enterovirus NOT DETECTED NOT DETECTED Final   Influenza A NOT DETECTED NOT DETECTED Final   Influenza B NOT DETECTED NOT DETECTED Final   Parainfluenza Virus 1 NOT DETECTED NOT DETECTED Final   Parainfluenza Virus 2 NOT DETECTED NOT DETECTED Final   Parainfluenza Virus 3 NOT DETECTED NOT DETECTED Final   Parainfluenza Virus 4 NOT DETECTED NOT DETECTED Final   Respiratory Syncytial Virus NOT DETECTED NOT DETECTED Final   Bordetella pertussis NOT DETECTED NOT DETECTED Final   Chlamydophila pneumoniae NOT DETECTED NOT DETECTED Final   Mycoplasma pneumoniae NOT DETECTED NOT DETECTED Final    Comment: Performed at [facility], [address] [place], [place] [address]  Culture, blood (routine x 2)     Status: None (Preliminary result)   Collection Time: 08/20/17  3:50 PM  Result Value Ref Range Status   Specimen Description BLOOD RIGHT HAND  Final   Special Requests IN PEDIATRIC BOTTLE Blood Culture adequate volume  Final   Culture   Final    NO GROWTH 2 DAYS Performed at [facility], [place] [address] [place], [place] [address]    Report Status PENDING  Incomplete  Culture, blood (routine x 2)     Status: None (Preliminary result)   Collection Time: 08/20/17  4:08 PM  Result Value Ref Range Status   Specimen Description BLOOD LEFT ANTECUBITAL  Final   Special Requests IN PEDIATRIC BOTTLE Blood Culture adequate volume  Final   Culture   Final    NO GROWTH 2 DAYS Performed at [facility], [place] [address] [place], [place] [address]    Report Status PENDING  Incomplete     Labs: Basic Metabolic Panel: Recent Labs  Lab 08/18/17 0905 08/19/17 0320 08/20/17 0350 08/21/17 0403 08/22/17 0451  NA 138 139 138 135 137  K 4.4 3.9 4.1 3.6 3.9  CL 105 104 104 101 103  CO2 20* 24 24 23 23   GLUCOSE 316* 200* 196* 182* 216*  BUN 29* 29* 33* 36* 42*    CREATININE 1.72* 1.70* 1.74* 1.97* 1.91*  CALCIUM 9.1 9.2 8.9 8.6* 9.0  MG  --   --   --  1.8  --    Liver Function Tests: Recent Labs  Lab 08/18/17 0905 08/20/17 0350  AST 21 23  ALT 19 15  ALKPHOS 102 87  BILITOT 0.7 0.8  PROT 8.3* 6.5  ALBUMIN 3.5 2.9*   No results for input(s): LIPASE, AMYLASE in the last 168 hours. No results for input(s): AMMONIA in the last 168  hours. CBC: Recent Labs  Lab 08/18/17 0905 08/20/17 0350 08/21/17 0403  WBC 9.7 6.1 5.2  NEUTROABS 5.8  --   --   HGB 10.8* 10.2* 9.9*  HCT 33.8* 31.6* 30.8*  MCV 88.7 87.5 86.8  PLT 392 342 322   Cardiac Enzymes: Recent Labs  Lab 08/18/17 0905  TROPONINI <0.03   BNP: BNP (last 3 results) Recent Labs    08/18/17 0905  BNP 870.3*    ProBNP (last 3 results) No results for input(s): PROBNP in the last 8760 hours.  CBG: Recent Labs  Lab 08/21/17 1056 08/21/17 1610 08/21/17 2132 08/22/17 0800 08/22/17 1148  GLUCAP 238* 221* 293* 190* 224*       Signed:  Desiree Hane, MD [facility] 08/22/2017, 3:45 PM

## 2017-08-22 NOTE — Discharge Instructions (Signed)
Please see your primary care doctor in one week after your discharge. You will need lab work to monitor your kidney function  Your new medications include  Lasix (furosemide) 80 mg twice a day ( 2 40 mg tabs twice daily). If you notice any increased lightheadedness or low blood pressure decrease to 80 mg daily and see your doctor Labetalol 300 mg twice daily for your blood pressure  Stop taking your losartan until seen by your primary care doctor to discuss your kidney function.     Heart Failure Action Plan A heart failure action plan helps you understand what to do when you have symptoms of heart failure. Follow the plan that was created by you and your health care provider. Review your plan each time you visit your health care provider. Red zone These signs and symptoms mean you should get medical help right away:  You have trouble breathing when resting.  You have a dry cough that is getting worse.  You have swelling or pain in your legs or abdomen that is getting worse.  You suddenly gain more than 2-3 lb (0.9-1.4 kg) in a day, or more than 5 lb (2.3 kg) in one week. This amount may be more or less depending on your condition.  You have trouble staying awake or you feel confused.  You have chest pain.  You do not have an appetite.  You pass out.  If you experience any of these symptoms:  Call your local emergency services (911 in the [place] right away or seek help at the emergency department of the nearest hospital.         Yellow zone These signs and symptoms mean your condition may be getting worse and you should make some changes:  You have trouble breathing when you are active or you need to sleep with extra pillows.  You have swelling in your legs or abdomen.  You gain 2-3 lb (0.9-1.4 kg) in one day, or 5 lb (2.3 kg) in one week. This amount may be more or less depending on your condition.  You get tired easily.  You have trouble sleeping.  You have a  dry cough.         If you experience any of these symptoms:  Contact your health care provider within the next day.  Your health care provider may adjust your medicines.      Green zone These signs mean you are doing well and can continue what you are doing:  You do not have shortness of breath.  You have very little swelling or no new swelling.  Your weight is stable (no gain or loss).  You have a normal activity level.  You do not have chest pain or any other new symptoms.     Follow these instructions at home:  Take over-the-counter and prescription medicines only as told by your health care provider.  Weigh yourself daily. Your target weight is __________ lb (__________ kg). ? Call your health care provider if you gain more than __________ lb (__________ kg) in a day, or more than __________ lb (__________ kg) in one week.  Eat a heart-healthy diet. Work with a diet and nutrition specialist (dietitian) to create an eating plan that is best for you.  Keep all follow-up visits as told by your health care provider. This is important. Where to find more information:  American Heart Association: www.heart.org Summary  Follow the action plan that was created by you and your health care  provider.  Get help right away if you have any symptoms in the Red zone. This information is not intended to replace advice given to you by your health care provider. Make sure you discuss any questions you have with your health care provider. Document Released: 07/14/2016 Document Revised: 07/14/2016 Document Reviewed: 07/14/2016 Elsevier Interactive Patient Education  2018 [place] With Less [employer] with less salt is one way to reduce the amount of sodium you get from food. Depending on your condition and overall health, your health care provider or diet and nutrition specialist (dietitian) may recommend that you reduce your sodium intake. Most people  should have less than 2,300 milligrams (mg) of sodium each day. If you have high blood pressure (hypertension), you may need to limit your sodium to 1,500 mg each day. Follow the tips below to help reduce your sodium intake. What do I need to know about cooking with less salt? Shopping  Buy sodium-free or low-sodium products. Look for the following words on food labels: ? Low-sodium. ? Sodium-free. ? Reduced-sodium. ? No salt added. ? Unsalted.  Buy fresh or frozen vegetables. Avoid canned vegetables.  Avoid buying meats or protein foods that have been injected with broth or saline solution.  Avoid cured or smoked meats, such as hot dogs, bacon, salami, ham, and bologna. Reading food labels  Check the food label before buying or using packaged ingredients.  Look for products with no more than 140 mg of sodium in one serving.  Do not choose foods with salt as one of the first three ingredients on the ingredients list. If salt is one of the first three ingredients, it usually means the item is high in sodium, because ingredients are listed in order of amount in the food item. Cooking  Use herbs, seasonings without salt, and spices as substitutes for salt in foods.  Use sodium-free baking soda when baking.  Grill, braise, or roast foods to add flavor with less salt.  Avoid adding salt to pasta, rice, or hot cereals while cooking.  Drain and rinse canned vegetables before use.  Avoid adding salt when cooking sweets and desserts.  Cook with low-sodium ingredients. What are some salt alternatives? The following are herbs, seasonings, and spices that can be used instead of salt to give taste to your food. Herbs should be fresh or dried. Do not choose packaged mixes. Next to the name of the herb, spice, or seasoning are some examples of foods you can pair it with. Herbs  Bay leaves - Soups, meat and vegetable dishes, and spaghetti sauce.  Basil - [employer], soups, pasta, and  fish dishes.  Cilantro - Meat, poultry, and vegetable dishes.  Chili powder - Marinades and Mexican dishes.  Chives - Salad dressings and potato dishes.  Cumin - Mexican dishes, couscous, and meat dishes.  Dill - Fish dishes, sauces, and salads.  Fennel - Meat and vegetable dishes, breads, and cookies.  Garlic (do not use garlic salt) - [place] dishes, meat dishes, salad dressings, and sauces.  Marjoram - Soups, potato dishes, and meat dishes.  Oregano - Pizza and spaghetti sauce.  Parsley - Salads, soups, pasta, and meat dishes.  Rosemary - [place] dishes, salad dressings, soups, and red meats.  Saffron - Fish dishes, pasta, and some poultry dishes.  Sage - Stuffings and sauces.  Tarragon - Fish and [employer].  Thyme - Stuffing, meat, and fish dishes. Seasonings  Lemon juice - Fish dishes, poultry dishes, vegetables,  and salads.  Vinegar - Salad dressings, vegetables, and fish dishes. Spices  Cinnamon - Sweet dishes, such as cakes, cookies, and puddings.  Cloves - Gingerbread, puddings, and marinades for meats.  Curry - Vegetable dishes, fish and poultry dishes, and stir-fry dishes.  Ginger - Vegetables dishes, fish dishes, and stir-fry dishes.  Nutmeg - Pasta, vegetables, poultry, fish dishes, and custard. What are some low-sodium ingredients and foods?  Fresh or frozen fruits and vegetables with no sauce added.  Fresh or frozen whole meats, poultry, and fish with no sauce added.  Eggs.  Noodles, pasta, quinoa, rice.  Shredded or puffed wheat or puffed rice.  Regular or quick oats.  Milk, yogurt, hard cheeses, and low-sodium cheeses. Good cheese choices include Swiss, [place]. Always check the label for the serving size and sodium content.  Unsalted butter or margarine.  Unsalted nuts.  Sherbet or ice cream (keep to  cup per serving).  Homemade pudding.  Sodium-free baking soda and baking powder. This is not a  complete list of low-sodium ingredients and foods. Contact your dietitian for more options. Summary  Cooking with less salt is one way to reduce the amount of sodium that you get from food.  Buy sodium-free or low-sodium products.  Check the food label before using or buying packaged ingredients.  Use herbs, seasonings without salt, and spices as substitutes for salt in foods. This information is not intended to replace advice given to you by your health care provider. Make sure you discuss any questions you have with your health care provider. Document Released: 06/04/2005 Document Revised: 06/12/2016 Document Reviewed: 06/12/2016 Elsevier Interactive Patient Education  2017 [employer].

## 2017-08-22 NOTE — Care Management Important Message (Signed)
Important Message  Patient Details  Name: Charlotte Phelps MRN: [id number] Date of Birth: 08/05/1945   Medicare Important Message Given:  Yes    Charlotte Phelps Charlotte Phelps 08/22/2017, 1:17 PM

## 2017-08-25 LAB — CULTURE, BLOOD (ROUTINE X 2)
Culture: NO GROWTH
Culture: NO GROWTH
Special Requests: ADEQUATE
Special Requests: ADEQUATE

## 2019-01-17 DEATH — deceased

## 2019-11-16 IMAGING — DX DG CHEST 1V PORT
1 series · 1 of 1 positions shown · non-contrast
Comparison: Single-view of the chest 08/18/2017.

CLINICAL DATA: Hypoxia.  History of breast cancer.

EXAM:
PORTABLE CHEST 1 VIEW

[chest ap]
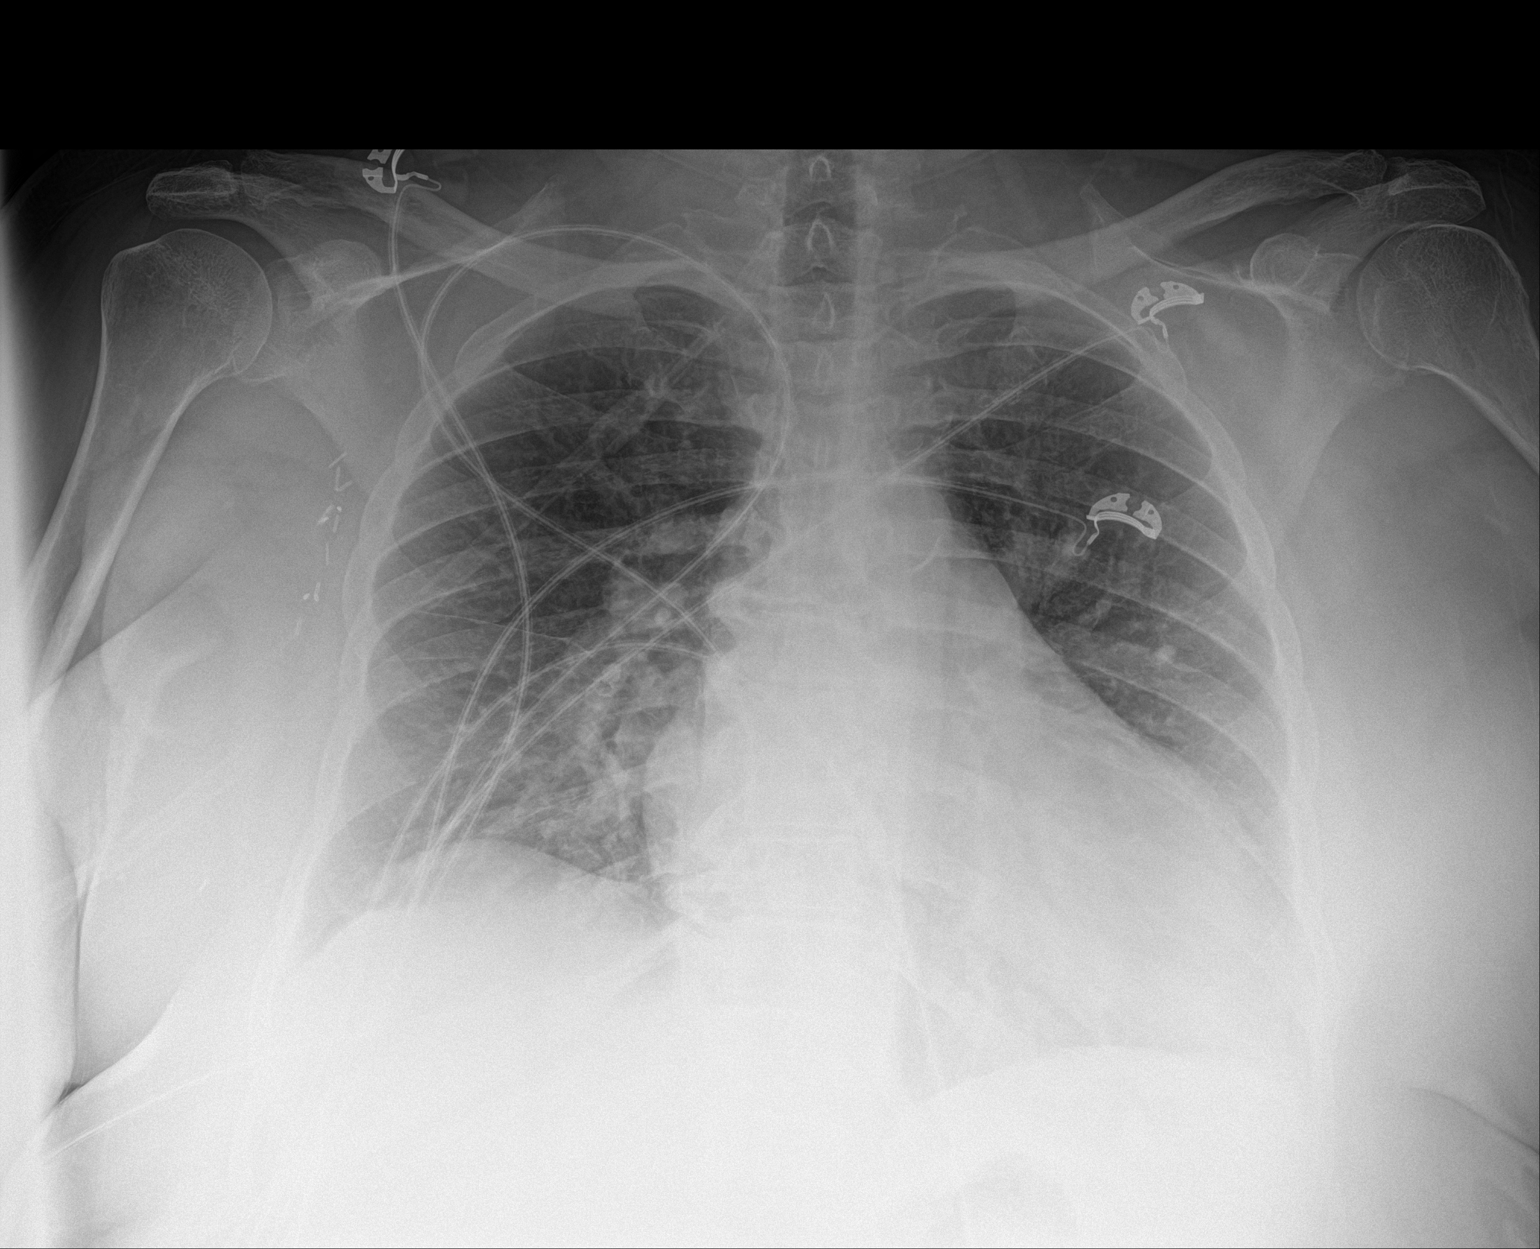

[1 of 1 positions shown; findings below may reference images not displayed]

FINDINGS: Cardiomegaly is again seen. Pulmonary edema present on the prior
examination has markedly improved. No pneumothorax or pleural
effusion. Aortic atherosclerosis is seen. The patient is status post
right axillary dissection.
IMPRESSION: Marked improvement in pulmonary edema.  No new abnormality.
